# Patient Record
Sex: Male | Born: 1935 | Race: White | Hispanic: No | Marital: Married | Smoking: Never smoker
Health system: Southern US, Community
[De-identification: ages and names within clinical notes are randomized; demographics above are authoritative.]

## PROBLEM LIST (undated history)

## (undated) ENCOUNTER — Emergency Department: Payer: Medicare Other | Source: Home / Self Care

## (undated) DIAGNOSIS — E785 Hyperlipidemia, unspecified: Secondary | ICD-10-CM

## (undated) DIAGNOSIS — N400 Enlarged prostate without lower urinary tract symptoms: Secondary | ICD-10-CM

## (undated) DIAGNOSIS — D473 Essential (hemorrhagic) thrombocythemia: Secondary | ICD-10-CM

## (undated) DIAGNOSIS — I10 Essential (primary) hypertension: Secondary | ICD-10-CM

## (undated) HISTORY — DX: Essential (primary) hypertension: I10

## (undated) HISTORY — DX: Hyperlipidemia, unspecified: E78.5

## (undated) HISTORY — PX: SKIN CANCER EXCISION: SHX779

## (undated) HISTORY — DX: Benign prostatic hyperplasia without lower urinary tract symptoms: N40.0

## (undated) HISTORY — DX: Essential (hemorrhagic) thrombocythemia: D47.3

---

## 1999-06-01 ENCOUNTER — Ambulatory Visit (HOSPITAL_COMMUNITY): Admission: RE | Admit: 1999-06-01 | Discharge: 1999-06-01 | Payer: Self-pay | Admitting: Internal Medicine

## 1999-06-01 ENCOUNTER — Encounter: Payer: Self-pay | Admitting: Internal Medicine

## 2008-01-29 ENCOUNTER — Ambulatory Visit: Payer: Self-pay | Admitting: Oncology

## 2008-02-03 LAB — CBC WITH DIFFERENTIAL/PLATELET
BASO%: 0.2 % (ref 0.0–2.0)
Basophils Absolute: 0 10*3/uL (ref 0.0–0.1)
EOS%: 3.6 % (ref 0.0–7.0)
HCT: 50.8 % — ABNORMAL HIGH (ref 38.7–49.9)
HGB: 16.8 g/dL (ref 13.0–17.1)
LYMPH%: 14.2 % (ref 14.0–48.0)
MCH: 25 pg — ABNORMAL LOW (ref 28.0–33.4)
MCHC: 33 g/dL (ref 32.0–35.9)
MCV: 75.9 fL — ABNORMAL LOW (ref 81.6–98.0)
MONO%: 5.1 % (ref 0.0–13.0)
NEUT%: 76.9 % — ABNORMAL HIGH (ref 40.0–75.0)
Platelets: 644 10*3/uL — ABNORMAL HIGH (ref 145–400)
lymph#: 1.1 10*3/uL (ref 0.9–3.3)

## 2008-02-06 LAB — IRON AND TIBC
Iron: 132 ug/dL (ref 42–165)
TIBC: 366 ug/dL (ref 215–435)
UIBC: 234 ug/dL

## 2008-02-06 LAB — COMPREHENSIVE METABOLIC PANEL
AST: 22 U/L (ref 0–37)
Albumin: 4.6 g/dL (ref 3.5–5.2)
Alkaline Phosphatase: 80 U/L (ref 39–117)
BUN: 12 mg/dL (ref 6–23)
Creatinine, Ser: 1.24 mg/dL (ref 0.40–1.50)
Glucose, Bld: 90 mg/dL (ref 70–99)
Potassium: 4.5 mEq/L (ref 3.5–5.3)

## 2008-02-06 LAB — FERRITIN: Ferritin: 17 ng/mL — ABNORMAL LOW (ref 22–322)

## 2008-04-30 ENCOUNTER — Ambulatory Visit: Payer: Self-pay | Admitting: Oncology

## 2008-07-12 ENCOUNTER — Ambulatory Visit: Payer: Self-pay | Admitting: Oncology

## 2008-07-14 LAB — CBC WITH DIFFERENTIAL/PLATELET
BASO%: 0.3 % (ref 0.0–2.0)
Basophils Absolute: 0 10*3/uL (ref 0.0–0.1)
Eosinophils Absolute: 0.3 10*3/uL (ref 0.0–0.5)
HCT: 53.2 % — ABNORMAL HIGH (ref 38.7–49.9)
HGB: 17.4 g/dL — ABNORMAL HIGH (ref 13.0–17.1)
LYMPH%: 16 % (ref 14.0–48.0)
MCHC: 32.7 g/dL (ref 32.0–35.9)
MONO#: 0.3 10*3/uL (ref 0.1–0.9)
NEUT%: 75.1 % — ABNORMAL HIGH (ref 40.0–75.0)
Platelets: 630 10*3/uL — ABNORMAL HIGH (ref 145–400)
WBC: 6.9 10*3/uL (ref 4.0–10.0)
lymph#: 1.1 10*3/uL (ref 0.9–3.3)

## 2008-11-09 ENCOUNTER — Ambulatory Visit: Payer: Self-pay | Admitting: Oncology

## 2008-11-11 LAB — CBC WITH DIFFERENTIAL/PLATELET
BASO%: 0.2 % (ref 0.0–2.0)
Basophils Absolute: 0 10*3/uL (ref 0.0–0.1)
EOS%: 4.4 % (ref 0.0–7.0)
HCT: 53.1 % — ABNORMAL HIGH (ref 38.4–49.9)
HGB: 17.7 g/dL — ABNORMAL HIGH (ref 13.0–17.1)
LYMPH%: 13.7 % — ABNORMAL LOW (ref 14.0–49.0)
MCH: 24.8 pg — ABNORMAL LOW (ref 27.2–33.4)
MCHC: 33.3 g/dL (ref 32.0–36.0)
NEUT%: 77.6 % — ABNORMAL HIGH (ref 39.0–75.0)
Platelets: 726 10*3/uL — ABNORMAL HIGH (ref 140–400)

## 2008-11-11 LAB — COMPREHENSIVE METABOLIC PANEL
ALT: 15 U/L (ref 0–53)
BUN: 12 mg/dL (ref 6–23)
CO2: 24 mEq/L (ref 19–32)
Calcium: 9.5 mg/dL (ref 8.4–10.5)
Creatinine, Ser: 1.28 mg/dL (ref 0.40–1.50)
Total Bilirubin: 1.7 mg/dL — ABNORMAL HIGH (ref 0.3–1.2)

## 2009-03-14 ENCOUNTER — Ambulatory Visit: Payer: Self-pay | Admitting: Oncology

## 2009-03-16 LAB — CBC WITH DIFFERENTIAL/PLATELET
Basophils Absolute: 0 10*3/uL (ref 0.0–0.1)
Eosinophils Absolute: 0.2 10*3/uL (ref 0.0–0.5)
HGB: 17.7 g/dL — ABNORMAL HIGH (ref 13.0–17.1)
MCV: 75 fL — ABNORMAL LOW (ref 79.3–98.0)
MONO#: 0.4 10*3/uL (ref 0.1–0.9)
MONO%: 5.1 % (ref 0.0–14.0)
NEUT#: 5.7 10*3/uL (ref 1.5–6.5)
RDW: 17.2 % — ABNORMAL HIGH (ref 11.0–14.6)
WBC: 7.3 10*3/uL (ref 4.0–10.3)
lymph#: 0.9 10*3/uL (ref 0.9–3.3)

## 2009-03-16 LAB — COMPREHENSIVE METABOLIC PANEL
Albumin: 4.5 g/dL (ref 3.5–5.2)
BUN: 11 mg/dL (ref 6–23)
CO2: 27 mEq/L (ref 19–32)
Calcium: 9.7 mg/dL (ref 8.4–10.5)
Chloride: 103 mEq/L (ref 96–112)
Glucose, Bld: 89 mg/dL (ref 70–99)
Potassium: 4.2 mEq/L (ref 3.5–5.3)
Total Protein: 7.5 g/dL (ref 6.0–8.3)

## 2009-03-16 LAB — IRON AND TIBC
Iron: 65 ug/dL (ref 42–165)
TIBC: 392 ug/dL (ref 215–435)
UIBC: 327 ug/dL

## 2009-07-18 ENCOUNTER — Ambulatory Visit: Payer: Self-pay | Admitting: Oncology

## 2009-07-20 ENCOUNTER — Ambulatory Visit (HOSPITAL_COMMUNITY): Admission: RE | Admit: 2009-07-20 | Discharge: 2009-07-20 | Payer: Self-pay | Admitting: Oncology

## 2009-07-20 LAB — CBC WITH DIFFERENTIAL/PLATELET
Basophils Absolute: 0 10*3/uL (ref 0.0–0.1)
EOS%: 4.4 % (ref 0.0–7.0)
Eosinophils Absolute: 0.4 10*3/uL (ref 0.0–0.5)
HCT: 52.8 % — ABNORMAL HIGH (ref 38.4–49.9)
HGB: 17 g/dL (ref 13.0–17.1)
MCH: 24.4 pg — ABNORMAL LOW (ref 27.2–33.4)
MONO#: 0.2 10*3/uL (ref 0.1–0.9)
NEUT#: 7.7 10*3/uL — ABNORMAL HIGH (ref 1.5–6.5)
NEUT%: 78.8 % — ABNORMAL HIGH (ref 39.0–75.0)
RDW: 16.9 % — ABNORMAL HIGH (ref 11.0–14.6)
lymph#: 1.5 10*3/uL (ref 0.9–3.3)

## 2009-07-20 LAB — COMPREHENSIVE METABOLIC PANEL
AST: 23 U/L (ref 0–37)
Albumin: 4.4 g/dL (ref 3.5–5.2)
BUN: 13 mg/dL (ref 6–23)
CO2: 25 mEq/L (ref 19–32)
Calcium: 9.9 mg/dL (ref 8.4–10.5)
Chloride: 104 mEq/L (ref 96–112)
Creatinine, Ser: 1.19 mg/dL (ref 0.40–1.50)
Glucose, Bld: 98 mg/dL (ref 70–99)
Potassium: 4.6 mEq/L (ref 3.5–5.3)

## 2009-08-15 ENCOUNTER — Ambulatory Visit: Payer: Self-pay | Admitting: Oncology

## 2009-08-15 ENCOUNTER — Ambulatory Visit (HOSPITAL_COMMUNITY): Admission: RE | Admit: 2009-08-15 | Discharge: 2009-08-15 | Payer: Self-pay | Admitting: Oncology

## 2009-08-17 ENCOUNTER — Ambulatory Visit: Payer: Self-pay | Admitting: Oncology

## 2009-09-19 ENCOUNTER — Ambulatory Visit: Payer: Self-pay | Admitting: Oncology

## 2009-09-20 LAB — COMPREHENSIVE METABOLIC PANEL
AST: 24 U/L (ref 0–37)
Albumin: 4.4 g/dL (ref 3.5–5.2)
BUN: 17 mg/dL (ref 6–23)
Calcium: 9.3 mg/dL (ref 8.4–10.5)
Chloride: 102 mEq/L (ref 96–112)
Creatinine, Ser: 1.25 mg/dL (ref 0.40–1.50)
Glucose, Bld: 100 mg/dL — ABNORMAL HIGH (ref 70–99)
Potassium: 4.4 mEq/L (ref 3.5–5.3)

## 2009-09-20 LAB — CBC WITH DIFFERENTIAL/PLATELET
Basophils Absolute: 0.1 10*3/uL (ref 0.0–0.1)
EOS%: 3.7 % (ref 0.0–7.0)
Eosinophils Absolute: 0.2 10*3/uL (ref 0.0–0.5)
HCT: 50.8 % — ABNORMAL HIGH (ref 38.4–49.9)
HGB: 16.8 g/dL (ref 13.0–17.1)
MCH: 25.8 pg — ABNORMAL LOW (ref 27.2–33.4)
MCV: 78.1 fL — ABNORMAL LOW (ref 79.3–98.0)
MONO%: 7.4 % (ref 0.0–14.0)
NEUT#: 4 10*3/uL (ref 1.5–6.5)
NEUT%: 65.6 % (ref 39.0–75.0)
lymph#: 1.4 10*3/uL (ref 0.9–3.3)

## 2009-10-18 LAB — CBC WITH DIFFERENTIAL/PLATELET
BASO%: 0.1 % (ref 0.0–2.0)
Basophils Absolute: 0 10*3/uL (ref 0.0–0.1)
EOS%: 2.6 % (ref 0.0–7.0)
Eosinophils Absolute: 0.2 10*3/uL (ref 0.0–0.5)
HCT: 48.9 % (ref 38.4–49.9)
HGB: 16.4 g/dL (ref 13.0–17.1)
LYMPH%: 18.3 % (ref 14.0–49.0)
MCH: 26.8 pg — ABNORMAL LOW (ref 27.2–33.4)
MCHC: 33.5 g/dL (ref 32.0–36.0)
MCV: 80.1 fL (ref 79.3–98.0)
MONO#: 0.3 10*3/uL (ref 0.1–0.9)
MONO%: 5.2 % (ref 0.0–14.0)
NEUT#: 4.7 10*3/uL (ref 1.5–6.5)
NEUT%: 73.8 % (ref 39.0–75.0)
Platelets: 402 10*3/uL — ABNORMAL HIGH (ref 140–400)
RBC: 6.1 10*6/uL — ABNORMAL HIGH (ref 4.20–5.82)
RDW: 23.4 % — ABNORMAL HIGH (ref 11.0–14.6)
WBC: 6.4 10*3/uL (ref 4.0–10.3)
lymph#: 1.2 10*3/uL (ref 0.9–3.3)

## 2009-10-18 LAB — COMPREHENSIVE METABOLIC PANEL
ALT: 17 U/L (ref 0–53)
AST: 23 U/L (ref 0–37)
Albumin: 4.1 g/dL (ref 3.5–5.2)
Alkaline Phosphatase: 72 U/L (ref 39–117)
BUN: 12 mg/dL (ref 6–23)
CO2: 24 mEq/L (ref 19–32)
Calcium: 9.2 mg/dL (ref 8.4–10.5)
Chloride: 103 mEq/L (ref 96–112)
Creatinine, Ser: 1.24 mg/dL (ref 0.40–1.50)
Glucose, Bld: 88 mg/dL (ref 70–99)
Potassium: 4.6 mEq/L (ref 3.5–5.3)
Sodium: 138 mEq/L (ref 135–145)
Total Bilirubin: 1.1 mg/dL (ref 0.3–1.2)
Total Protein: 7.1 g/dL (ref 6.0–8.3)

## 2009-12-16 ENCOUNTER — Ambulatory Visit: Payer: Self-pay | Admitting: Oncology

## 2009-12-20 LAB — COMPREHENSIVE METABOLIC PANEL
ALT: 15 U/L (ref 0–53)
AST: 21 U/L (ref 0–37)
Albumin: 4.4 g/dL (ref 3.5–5.2)
Alkaline Phosphatase: 71 U/L (ref 39–117)
BUN: 13 mg/dL (ref 6–23)
CO2: 21 mEq/L (ref 19–32)
Calcium: 9.4 mg/dL (ref 8.4–10.5)
Chloride: 101 mEq/L (ref 96–112)
Creatinine, Ser: 1.09 mg/dL (ref 0.40–1.50)
Glucose, Bld: 110 mg/dL — ABNORMAL HIGH (ref 70–99)
Potassium: 4.5 mEq/L (ref 3.5–5.3)
Sodium: 138 mEq/L (ref 135–145)
Total Bilirubin: 1.1 mg/dL (ref 0.3–1.2)
Total Protein: 7.4 g/dL (ref 6.0–8.3)

## 2009-12-20 LAB — CBC WITH DIFFERENTIAL/PLATELET
BASO%: 0.1 % (ref 0.0–2.0)
Basophils Absolute: 0 10*3/uL (ref 0.0–0.1)
EOS%: 3.1 % (ref 0.0–7.0)
Eosinophils Absolute: 0.2 10*3/uL (ref 0.0–0.5)
HCT: 51 % — ABNORMAL HIGH (ref 38.4–49.9)
HGB: 17.1 g/dL (ref 13.0–17.1)
LYMPH%: 16.6 % (ref 14.0–49.0)
MCH: 29.7 pg (ref 27.2–33.4)
MCHC: 33.5 g/dL (ref 32.0–36.0)
MCV: 88.7 fL (ref 79.3–98.0)
MONO#: 0.4 10*3/uL (ref 0.1–0.9)
MONO%: 5.5 % (ref 0.0–14.0)
NEUT#: 4.7 10*3/uL (ref 1.5–6.5)
NEUT%: 74.7 % (ref 39.0–75.0)
Platelets: 361 10*3/uL (ref 140–400)
RBC: 5.75 10*6/uL (ref 4.20–5.82)
RDW: 19.3 % — ABNORMAL HIGH (ref 11.0–14.6)
WBC: 6.4 10*3/uL (ref 4.0–10.3)
lymph#: 1.1 10*3/uL (ref 0.9–3.3)

## 2010-03-21 ENCOUNTER — Ambulatory Visit: Payer: Self-pay | Admitting: Oncology

## 2010-03-23 LAB — CBC WITH DIFFERENTIAL/PLATELET
BASO%: 0.3 % (ref 0.0–2.0)
Basophils Absolute: 0 10*3/uL (ref 0.0–0.1)
EOS%: 3.2 % (ref 0.0–7.0)
Eosinophils Absolute: 0.2 10*3/uL (ref 0.0–0.5)
HCT: 43.1 % (ref 38.4–49.9)
HGB: 15 g/dL (ref 13.0–17.1)
LYMPH%: 21.4 % (ref 14.0–49.0)
MCH: 34.2 pg — ABNORMAL HIGH (ref 27.2–33.4)
MCHC: 34.8 g/dL (ref 32.0–36.0)
MCV: 98 fL (ref 79.3–98.0)
MONO#: 0.3 10*3/uL (ref 0.1–0.9)
MONO%: 5.9 % (ref 0.0–14.0)
NEUT#: 3.4 10*3/uL (ref 1.5–6.5)
NEUT%: 69.2 % (ref 39.0–75.0)
Platelets: 453 10*3/uL — ABNORMAL HIGH (ref 140–400)
RBC: 4.4 10*6/uL (ref 4.20–5.82)
RDW: 17.2 % — ABNORMAL HIGH (ref 11.0–14.6)
WBC: 4.9 10*3/uL (ref 4.0–10.3)
lymph#: 1 10*3/uL (ref 0.9–3.3)

## 2010-03-23 LAB — COMPREHENSIVE METABOLIC PANEL
ALT: 20 U/L (ref 0–53)
AST: 23 U/L (ref 0–37)
Albumin: 4.3 g/dL (ref 3.5–5.2)
Alkaline Phosphatase: 65 U/L (ref 39–117)
BUN: 14 mg/dL (ref 6–23)
CO2: 27 mEq/L (ref 19–32)
Calcium: 9.6 mg/dL (ref 8.4–10.5)
Chloride: 101 mEq/L (ref 96–112)
Creatinine, Ser: 1.09 mg/dL (ref 0.40–1.50)
Glucose, Bld: 91 mg/dL (ref 70–99)
Potassium: 5 mEq/L (ref 3.5–5.3)
Sodium: 139 mEq/L (ref 135–145)
Total Bilirubin: 1.4 mg/dL — ABNORMAL HIGH (ref 0.3–1.2)
Total Protein: 7 g/dL (ref 6.0–8.3)

## 2010-06-21 ENCOUNTER — Ambulatory Visit: Payer: Self-pay | Admitting: Oncology

## 2010-06-23 LAB — CBC WITH DIFFERENTIAL/PLATELET
BASO%: 0.7 % (ref 0.0–2.0)
Basophils Absolute: 0 10*3/uL (ref 0.0–0.1)
EOS%: 2.5 % (ref 0.0–7.0)
Eosinophils Absolute: 0.1 10*3/uL (ref 0.0–0.5)
HCT: 43.3 % (ref 38.4–49.9)
HGB: 15.1 g/dL (ref 13.0–17.1)
LYMPH%: 24.1 % (ref 14.0–49.0)
MCH: 33.9 pg — ABNORMAL HIGH (ref 27.2–33.4)
MCHC: 34.9 g/dL (ref 32.0–36.0)
MCV: 97.1 fL (ref 79.3–98.0)
MONO#: 0.3 10*3/uL (ref 0.1–0.9)
MONO%: 5.6 % (ref 0.0–14.0)
NEUT#: 3 10*3/uL (ref 1.5–6.5)
NEUT%: 67.1 % (ref 39.0–75.0)
Platelets: 197 10*3/uL (ref 140–400)
RBC: 4.46 10*6/uL (ref 4.20–5.82)
RDW: 15.3 % — ABNORMAL HIGH (ref 11.0–14.6)
WBC: 4.5 10*3/uL (ref 4.0–10.3)
lymph#: 1.1 10*3/uL (ref 0.9–3.3)
nRBC: 0 % (ref 0–0)

## 2010-06-23 LAB — COMPREHENSIVE METABOLIC PANEL
ALT: 16 U/L (ref 0–53)
AST: 21 U/L (ref 0–37)
Albumin: 4.3 g/dL (ref 3.5–5.2)
Alkaline Phosphatase: 52 U/L (ref 39–117)
BUN: 10 mg/dL (ref 6–23)
CO2: 28 mEq/L (ref 19–32)
Calcium: 9.1 mg/dL (ref 8.4–10.5)
Chloride: 102 mEq/L (ref 96–112)
Creatinine, Ser: 1.18 mg/dL (ref 0.40–1.50)
Glucose, Bld: 96 mg/dL (ref 70–99)
Potassium: 4.3 mEq/L (ref 3.5–5.3)
Sodium: 138 mEq/L (ref 135–145)
Total Bilirubin: 1 mg/dL (ref 0.3–1.2)
Total Protein: 7.3 g/dL (ref 6.0–8.3)

## 2010-08-21 LAB — DIFFERENTIAL
Basophils Absolute: 0 10*3/uL (ref 0.0–0.1)
Eosinophils Relative: 5 % (ref 0–5)
Lymphocytes Relative: 18 % (ref 12–46)
Lymphs Abs: 1.3 10*3/uL (ref 0.7–4.0)
Monocytes Absolute: 0.4 10*3/uL (ref 0.1–1.0)
Neutro Abs: 4.9 10*3/uL (ref 1.7–7.7)

## 2010-08-21 LAB — CBC
HCT: 50.8 % (ref 39.0–52.0)
Hemoglobin: 16.2 g/dL (ref 13.0–17.0)
RBC: 6.7 MIL/uL — ABNORMAL HIGH (ref 4.22–5.81)
RDW: 17.9 % — ABNORMAL HIGH (ref 11.5–15.5)
WBC: 7.1 10*3/uL (ref 4.0–10.5)

## 2010-08-21 LAB — BONE MARROW EXAM

## 2010-10-24 ENCOUNTER — Other Ambulatory Visit: Payer: Self-pay | Admitting: Oncology

## 2010-10-24 ENCOUNTER — Encounter (HOSPITAL_BASED_OUTPATIENT_CLINIC_OR_DEPARTMENT_OTHER): Payer: Medicare Other | Admitting: Oncology

## 2010-10-24 DIAGNOSIS — D47Z9 Other specified neoplasms of uncertain behavior of lymphoid, hematopoietic and related tissue: Secondary | ICD-10-CM

## 2010-10-24 DIAGNOSIS — D473 Essential (hemorrhagic) thrombocythemia: Secondary | ICD-10-CM

## 2010-10-24 LAB — CBC WITH DIFFERENTIAL/PLATELET
Basophils Absolute: 0 10*3/uL (ref 0.0–0.1)
Eosinophils Absolute: 0.2 10*3/uL (ref 0.0–0.5)
HGB: 13.5 g/dL (ref 13.0–17.1)
LYMPH%: 23.9 % (ref 14.0–49.0)
MCV: 100.6 fL — ABNORMAL HIGH (ref 79.3–98.0)
MONO%: 7.2 % (ref 0.0–14.0)
NEUT#: 2.8 10*3/uL (ref 1.5–6.5)
Platelets: 417 10*3/uL — ABNORMAL HIGH (ref 140–400)

## 2010-10-24 LAB — COMPREHENSIVE METABOLIC PANEL
Albumin: 4.2 g/dL (ref 3.5–5.2)
Alkaline Phosphatase: 54 U/L (ref 39–117)
BUN: 10 mg/dL (ref 6–23)
Glucose, Bld: 89 mg/dL (ref 70–99)
Potassium: 4 mEq/L (ref 3.5–5.3)

## 2011-01-25 ENCOUNTER — Other Ambulatory Visit: Payer: Self-pay | Admitting: Medical

## 2011-01-25 ENCOUNTER — Encounter (HOSPITAL_BASED_OUTPATIENT_CLINIC_OR_DEPARTMENT_OTHER): Payer: Medicare Other | Admitting: Oncology

## 2011-01-25 DIAGNOSIS — D47Z9 Other specified neoplasms of uncertain behavior of lymphoid, hematopoietic and related tissue: Secondary | ICD-10-CM

## 2011-01-25 DIAGNOSIS — D473 Essential (hemorrhagic) thrombocythemia: Secondary | ICD-10-CM

## 2011-01-25 LAB — CBC WITH DIFFERENTIAL/PLATELET
BASO%: 0.6 % (ref 0.0–2.0)
EOS%: 3.1 % (ref 0.0–7.0)
HCT: 43.6 % (ref 38.4–49.9)
LYMPH%: 19.3 % (ref 14.0–49.0)
MCH: 31.1 pg (ref 27.2–33.4)
MCHC: 34.4 g/dL (ref 32.0–36.0)
NEUT%: 68.4 % (ref 39.0–75.0)
Platelets: 372 10*3/uL (ref 140–400)
RBC: 4.83 10*6/uL (ref 4.20–5.82)
lymph#: 1 10*3/uL (ref 0.9–3.3)
nRBC: 0 % (ref 0–0)

## 2011-01-25 LAB — COMPREHENSIVE METABOLIC PANEL
AST: 20 U/L (ref 0–37)
Alkaline Phosphatase: 50 U/L (ref 39–117)
BUN: 14 mg/dL (ref 6–23)
Creatinine, Ser: 1 mg/dL (ref 0.50–1.35)
Total Bilirubin: 0.9 mg/dL (ref 0.3–1.2)

## 2011-04-14 ENCOUNTER — Telehealth: Payer: Self-pay | Admitting: Oncology

## 2011-04-14 NOTE — Telephone Encounter (Signed)
S/w pt, advised 11/29 appt has been cx'd due to Epic. New appt is 06/26/11 @ 10am. Pt verbalized understanding. I have also mailed pt an appt calendar.

## 2011-06-19 ENCOUNTER — Encounter: Payer: Self-pay | Admitting: *Deleted

## 2011-06-26 ENCOUNTER — Other Ambulatory Visit (HOSPITAL_BASED_OUTPATIENT_CLINIC_OR_DEPARTMENT_OTHER): Payer: Medicare Other | Admitting: Lab

## 2011-06-26 ENCOUNTER — Telehealth: Payer: Self-pay | Admitting: Oncology

## 2011-06-26 ENCOUNTER — Ambulatory Visit (HOSPITAL_BASED_OUTPATIENT_CLINIC_OR_DEPARTMENT_OTHER): Payer: Medicare Other | Admitting: Oncology

## 2011-06-26 VITALS — BP 111/68 | HR 54 | Temp 97.2°F | Ht 69.0 in | Wt 167.7 lb

## 2011-06-26 DIAGNOSIS — D47Z9 Other specified neoplasms of uncertain behavior of lymphoid, hematopoietic and related tissue: Secondary | ICD-10-CM

## 2011-06-26 DIAGNOSIS — D473 Essential (hemorrhagic) thrombocythemia: Secondary | ICD-10-CM

## 2011-06-26 LAB — CBC WITH DIFFERENTIAL/PLATELET
Basophils Absolute: 0 10*3/uL (ref 0.0–0.1)
EOS%: 3.1 % (ref 0.0–7.0)
HCT: 44.8 % (ref 38.4–49.9)
HGB: 14.9 g/dL (ref 13.0–17.1)
LYMPH%: 20.7 % (ref 14.0–49.0)
MCH: 32.1 pg (ref 27.2–33.4)
MCV: 96.4 fL (ref 79.3–98.0)
MONO%: 6.3 % (ref 0.0–14.0)
NEUT%: 69.2 % (ref 39.0–75.0)
Platelets: 393 10*3/uL (ref 140–400)
lymph#: 1 10*3/uL (ref 0.9–3.3)

## 2011-06-26 LAB — COMPREHENSIVE METABOLIC PANEL
ALT: 15 U/L (ref 0–53)
AST: 22 U/L (ref 0–37)
Albumin: 4.2 g/dL (ref 3.5–5.2)
Alkaline Phosphatase: 51 U/L (ref 39–117)
Calcium: 8.8 mg/dL (ref 8.4–10.5)
Chloride: 102 mEq/L (ref 96–112)
Potassium: 4.3 mEq/L (ref 3.5–5.3)
Sodium: 138 mEq/L (ref 135–145)

## 2011-06-26 NOTE — Progress Notes (Signed)
Hematology and Oncology Follow Up Visit  Nicholas Contreras [id number] 08/07/1928 78 y.o. 06/26/2011 10:54 AM No primary provider on file.No ref. provider found   PRINCIPAL DIAGNOSIS:  This is an 78 year old gentleman with myeloproliferative disorder presenting with thrombocytosis, JAK2 positive.  He had a bone marrow biopsy in March 2011.  CURRENT THERAPY:  Hydroxyurea 500 mg to keep his platelet count around 400,000.  HISTORY OF PRESENT ILLNESS:  Nicholas Contreras presents today for a followup visit.  He has continued to tolerate hydroxyurea without any major toxicity.  Does not report any nausea,vomiting or GI toxicity to speak of at this point.  He is not reporting any changes in his performance status or activity level at this time. No  bleeding.No headaches.  He does report some occasional fatigue, but otherwise performance status and activity level remains excellent.  Medications:Current outpatient prescriptions:aspirin 81 MG tablet, Take 81 mg by mouth daily., Disp: , Rfl: ;  atenolol (TENORMIN) 50 MG tablet, Take 50 mg by mouth daily., Disp: , Rfl: ;  enalapril (VASOTEC) 20 MG tablet, Take 20 mg by mouth daily., Disp: , Rfl: ;  ergocalciferol (VITAMIN D2) 50000 UNITS capsule, Take 50,000 Units by mouth once a week., Disp: , Rfl: ;  felodipine (PLENDIL) 10 MG 24 hr tablet, Take 10 mg by mouth daily., Disp: , Rfl:  hydrochlorothiazide (HYDRODIURIL) 12.5 MG tablet, Take 12.5 mg by mouth daily., Disp: , Rfl: ;  hydroxyurea (HYDREA) 500 MG capsule, Take 500 mg by mouth daily. May take with food to minimize GI side effects., Disp: , Rfl: ;  lovastatin (MEVACOR) 20 MG tablet, Take 20 mg by mouth 2 (two) times daily., Disp: , Rfl: ;  Tamsulosin HCl (FLOMAX) 0.4 MG CAPS, Take 0.4 mg by mouth daily., Disp: , Rfl:   Allergies: Allergies no known allergies  Past Medical History, Surgical history, Social history, and Family History were reviewed and updated.   REVIEW OF SYSTEMS:   No blurred vision, double  vision,motor sensory neuropathy. No alteration in mental status, psychiatric issues, depression.  No fever, chills, sweats. No cough, hemoptysis, hematemesis.  No nausea, vomiting, abdominal pain, hematochezia, melena, genitourinary complaints.  Rest of review of systems unremarkable.  Physical Exam: VS:  BP 111/68 P: 54  R 20   T 97.2  Weight: 167.7  General:  Alert, awake gentleman appeared in no active distress.HEENT:  Head is normocephalic, atraumatic.  Pupils equal, round, reactive to light.  Oral mucosa moist and pink.  Neck:  Supple without lymphadenopathy.  Heart:  Regular rate and rhythm.  S1 and S2.  Lungs:  Clear to auscultation.  No rhonchi or wheezes.  No dullness to percussion.  Abdomen:  Soft, nontender.  No hepatosplenomegaly.  Extremities:  No edema.   Lab Results:   Lab 06/26/11 1019  WBC 4.7  HGB 14.9  HCT 44.8  PLT 393  MCV 96.4  MCH 32.1  MCHC 33.3  RDW 16.6*  LYMPHSABS 1.0  MONOABS 0.3  EOSABS 0.1  BASOSABS 0.0  BANDABS --    CMP   No results found for this basename: NA:5,K:5,CL:5,CO2:5,GLUCOSE:5,BUN:5,CREATININE:5,GFRCGP,:5,CALCIUM:5,MG:5,AST:5,ALT:5,ALKPHOS:5,BILITOT:5 in the last 168 hours      Component Value Date/Time   BILITOT 0.9 01/25/2011 1016   BILITOT 0.9 01/25/2011 1016    Radiological Studies:  No results found.   Impression and Plan: 1. Essential thrombocythemia.  His hydroxyurea at 500 mg is able to stabilize platelet counts adequately at this point.  Continue on the current dose. 2. Thrombosis prophylaxis.  Continue  to be on low-dose aspirin.  .    Patient is to return for a follow up visit in 6 months, at which time CBC with differential and CMET will be performed.  He knows to call our clinic if he has any questions or concerns.  Spent more than half the time coordinating care.    Marcos Eke, MD 1/29/201310:54 AM

## 2011-06-26 NOTE — Telephone Encounter (Signed)
gv pt appt schedule for July.  

## 2011-08-06 ENCOUNTER — Other Ambulatory Visit: Payer: Self-pay | Admitting: *Deleted

## 2011-12-25 ENCOUNTER — Other Ambulatory Visit (HOSPITAL_BASED_OUTPATIENT_CLINIC_OR_DEPARTMENT_OTHER): Payer: Medicare Other | Admitting: Lab

## 2011-12-25 ENCOUNTER — Telehealth: Payer: Self-pay | Admitting: Oncology

## 2011-12-25 ENCOUNTER — Ambulatory Visit (HOSPITAL_BASED_OUTPATIENT_CLINIC_OR_DEPARTMENT_OTHER): Payer: Medicare Other | Admitting: Oncology

## 2011-12-25 VITALS — BP 113/68 | HR 56 | Temp 97.3°F | Ht 69.0 in | Wt 164.4 lb

## 2011-12-25 DIAGNOSIS — D473 Essential (hemorrhagic) thrombocythemia: Secondary | ICD-10-CM

## 2011-12-25 LAB — COMPREHENSIVE METABOLIC PANEL WITH GFR
ALT: 14 U/L (ref 0–53)
AST: 20 U/L (ref 0–37)
Albumin: 4.5 g/dL (ref 3.5–5.2)
Alkaline Phosphatase: 61 U/L (ref 39–117)
BUN: 11 mg/dL (ref 6–23)
CO2: 25 meq/L (ref 19–32)
Calcium: 9.4 mg/dL (ref 8.4–10.5)
Chloride: 104 meq/L (ref 96–112)
Creatinine, Ser: 1.12 mg/dL (ref 0.50–1.35)
Glucose, Bld: 92 mg/dL (ref 70–99)
Potassium: 4.1 meq/L (ref 3.5–5.3)
Sodium: 140 meq/L (ref 135–145)
Total Bilirubin: 1 mg/dL (ref 0.3–1.2)
Total Protein: 7 g/dL (ref 6.0–8.3)

## 2011-12-25 LAB — CBC WITH DIFFERENTIAL/PLATELET
BASO%: 0.4 % (ref 0.0–2.0)
Basophils Absolute: 0 10*3/uL (ref 0.0–0.1)
EOS%: 3 % (ref 0.0–7.0)
HCT: 45.4 % (ref 38.4–49.9)
HGB: 15.4 g/dL (ref 13.0–17.1)
MCH: 32.8 pg (ref 27.2–33.4)
MCHC: 33.8 g/dL (ref 32.0–36.0)
MCV: 97.1 fL (ref 79.3–98.0)
MONO%: 6.4 % (ref 0.0–14.0)
NEUT%: 68.8 % (ref 39.0–75.0)
RDW: 13.6 % (ref 11.0–14.6)
lymph#: 1.2 10*3/uL (ref 0.9–3.3)

## 2011-12-25 NOTE — Progress Notes (Signed)
Hematology and Oncology Follow Up Visit  Nicholas Contreras [id number] 11-22-1928 78 y.o. 12/25/2011 9:46 AM Gwynneth Aliment, MDNo ref. provider found   PRINCIPAL DIAGNOSIS:  This is an 78 year old gentleman with myeloproliferative disorder presenting with thrombocytosis, JAK2 positive.  He had a bone marrow biopsy in March 2011.  CURRENT THERAPY:  Hydroxyurea 500 mg daily to keep his platelet count around 400,000.  HISTORY OF PRESENT ILLNESS:  Mr. Dunsworth presents today for a followup visit.  He has continued to tolerate hydroxyurea without any major toxicity.  Does not report any nausea,vomiting or GI toxicity to speak of at this point.  He is not reporting any changes in his performance status or activity level at this time. No  bleeding.No headaches.  He does report some occasional fatigue, but otherwise performance status and activity level remains excellent.  Medications:Current outpatient prescriptions:aspirin 81 MG tablet, Take 81 mg by mouth daily., Disp: , Rfl: ;  atenolol (TENORMIN) 50 MG tablet, Take 50 mg by mouth daily., Disp: , Rfl: ;  enalapril (VASOTEC) 20 MG tablet, Take 20 mg by mouth daily., Disp: , Rfl: ;  ergocalciferol (VITAMIN D2) 50000 UNITS capsule, Take 50,000 Units by mouth once a week., Disp: , Rfl: ;  felodipine (PLENDIL) 10 MG 24 hr tablet, Take 10 mg by mouth daily., Disp: , Rfl:  hydrochlorothiazide (HYDRODIURIL) 12.5 MG tablet, Take 12.5 mg by mouth daily., Disp: , Rfl: ;  hydroxyurea (HYDREA) 500 MG capsule, Take 500 mg by mouth daily. May take with food to minimize GI side effects., Disp: , Rfl: ;  lovastatin (MEVACOR) 20 MG tablet, Take 20 mg by mouth 2 (two) times daily., Disp: , Rfl: ;  Tamsulosin HCl (FLOMAX) 0.4 MG CAPS, Take 0.4 mg by mouth daily., Disp: , Rfl:   Allergies: No Known Allergies  Past Medical History, Surgical history, Social history, and Family History were reviewed and updated.   REVIEW OF SYSTEMS:   No blurred vision, double vision,motor  sensory neuropathy. No alteration in mental status, psychiatric issues, depression.  No fever, chills, sweats. No cough, hemoptysis, hematemesis.  No nausea, vomiting, abdominal pain, hematochezia, melena, genitourinary complaints.  Rest of review of systems unremarkable.  Physical Exam: VSS  General:  Alert, awake gentleman appeared in no active distress.HEENT:  Head is normocephalic, atraumatic.  Pupils equal, round, reactive to light.  Oral mucosa moist and pink.  Neck:  Supple without lymphadenopathy.  Heart:  Regular rate and rhythm.  S1 and S2.  Lungs:  Clear to auscultation.  No rhonchi or wheezes.  No dullness to percussion.  Abdomen:  Soft, nontender.  No hepatosplenomegaly.  Extremities:  No edema.   Lab Results:   Lab 12/25/11 0903  WBC 5.5  HGB 15.4  HCT 45.4  PLT 435*  MCV 97.1  MCH 32.8  MCHC 33.8  RDW 13.6  LYMPHSABS 1.2  MONOABS 0.4  EOSABS 0.2  BASOSABS 0.0  BANDABS --      Impression and Plan: 1. Essential thrombocythemia.  His hydroxyurea at 500 mg is able to stabilize platelet counts adequately at this point.  Continue on the current dose. 2. Thrombosis prophylaxis.  Continue to be on low-dose aspirin.   3. Follow up visit in 6 months, at which time CBC with differential and CMET will be performed.    [facility], MD 7/30/20139:46 AM

## 2011-12-25 NOTE — Telephone Encounter (Signed)
appts made and printed for pt aom °

## 2012-01-04 ENCOUNTER — Encounter: Payer: Self-pay | Admitting: Physician Assistant

## 2012-05-23 ENCOUNTER — Telehealth: Payer: Self-pay | Admitting: Oncology

## 2012-05-23 NOTE — Telephone Encounter (Signed)
s.w. pt and advised on d.t change of appt.....pt ok and aware °

## 2012-06-26 ENCOUNTER — Ambulatory Visit: Payer: Medicare Other | Admitting: Oncology

## 2012-06-26 ENCOUNTER — Other Ambulatory Visit: Payer: Medicare Other | Admitting: Lab

## 2012-07-11 ENCOUNTER — Other Ambulatory Visit: Payer: Medicare Other

## 2012-07-11 ENCOUNTER — Ambulatory Visit: Payer: Medicare Other | Admitting: Oncology

## 2012-09-12 ENCOUNTER — Other Ambulatory Visit: Payer: Self-pay | Admitting: Oncology

## 2013-05-17 ENCOUNTER — Other Ambulatory Visit: Payer: Self-pay | Admitting: Oncology

## 2013-10-27 ENCOUNTER — Other Ambulatory Visit: Payer: Self-pay | Admitting: Oncology

## 2014-01-20 ENCOUNTER — Other Ambulatory Visit: Payer: Self-pay | Admitting: Oncology

## 2014-07-05 ENCOUNTER — Telehealth: Payer: Self-pay | Admitting: *Deleted

## 2014-07-05 ENCOUNTER — Other Ambulatory Visit: Payer: Self-pay | Admitting: Oncology

## 2014-07-05 DIAGNOSIS — D471 Chronic myeloproliferative disease: Secondary | ICD-10-CM

## 2014-07-05 NOTE — Telephone Encounter (Signed)
rec'd refill request for hydroxyurea. Patient had cancelled last 2 appts for lab/dr. States it snowed and he could not make it in. States he took his last pill today. Will send pof to schedulers for 1st available. Note to dr Hazeline Junker desk.

## 2014-07-05 NOTE — Telephone Encounter (Signed)
Please do not refill. Note to dr Hazeline Junker desk, asking if okay to refill before next visit. Patient cancelled last 2 visits. Alyse Kathan rn ocn

## 2014-07-06 ENCOUNTER — Telehealth: Payer: Self-pay | Admitting: Oncology

## 2014-07-06 ENCOUNTER — Other Ambulatory Visit: Payer: Self-pay | Admitting: *Deleted

## 2014-07-06 MED ORDER — HYDROXYUREA 500 MG PO CAPS: 500.0000 mg | ORAL_CAPSULE | Freq: Every day | ORAL | Status: DC

## 2014-07-06 NOTE — Telephone Encounter (Signed)
per pof to sch 1st avai-cld & spoke to pt gave pt appt time & date-pt understood

## 2014-07-06 NOTE — Telephone Encounter (Signed)
Spoke with patient, hydroxyurea e-scribed. Encouraged patient to keep regularly scheduled appt. States he will if it doesn't snow.

## 2014-07-08 ENCOUNTER — Ambulatory Visit (HOSPITAL_BASED_OUTPATIENT_CLINIC_OR_DEPARTMENT_OTHER): Payer: Medicare Other | Admitting: Oncology

## 2014-07-08 ENCOUNTER — Telehealth: Payer: Self-pay | Admitting: Oncology

## 2014-07-08 ENCOUNTER — Other Ambulatory Visit (HOSPITAL_BASED_OUTPATIENT_CLINIC_OR_DEPARTMENT_OTHER): Payer: Medicare Other

## 2014-07-08 VITALS — BP 114/56 | HR 49 | Temp 98.0°F | Resp 18 | Ht 69.0 in | Wt 161.2 lb

## 2014-07-08 DIAGNOSIS — D75839 Thrombocytosis, unspecified: Secondary | ICD-10-CM

## 2014-07-08 DIAGNOSIS — D471 Chronic myeloproliferative disease: Secondary | ICD-10-CM

## 2014-07-08 DIAGNOSIS — D473 Essential (hemorrhagic) thrombocythemia: Secondary | ICD-10-CM

## 2014-07-08 LAB — COMPREHENSIVE METABOLIC PANEL (CC13)
ALBUMIN: 4.1 g/dL (ref 3.5–5.0)
ALT: 15 U/L (ref 0–55)
AST: 23 U/L (ref 5–34)
Alkaline Phosphatase: 70 U/L (ref 40–150)
Anion Gap: 9 mEq/L (ref 3–11)
BUN: 10.9 mg/dL (ref 7.0–26.0)
CALCIUM: 9.2 mg/dL (ref 8.4–10.4)
CHLORIDE: 105 meq/L (ref 98–109)
CO2: 27 mEq/L (ref 22–29)
Creatinine: 1.1 mg/dL (ref 0.7–1.3)
EGFR: 64 mL/min/{1.73_m2} — ABNORMAL LOW (ref >90)
Glucose: 90 mg/dl (ref 70–140)
POTASSIUM: 4.3 meq/L (ref 3.5–5.1)
Sodium: 141 mEq/L (ref 136–145)
Total Bilirubin: 1.15 mg/dL (ref 0.20–1.20)
Total Protein: 7.1 g/dL (ref 6.4–8.3)

## 2014-07-08 LAB — CBC WITH DIFFERENTIAL/PLATELET
BASO%: 0.5 % (ref 0.0–2.0)
BASOS ABS: 0 10*3/uL (ref 0.0–0.1)
EOS%: 3.9 % (ref 0.0–7.0)
Eosinophils Absolute: 0.2 10*3/uL (ref 0.0–0.5)
HCT: 44.3 % (ref 38.4–49.9)
HEMOGLOBIN: 14.6 g/dL (ref 13.0–17.1)
LYMPH#: 1.3 10*3/uL (ref 0.9–3.3)
LYMPH%: 22.1 % (ref 14.0–49.0)
MCH: 32.7 pg (ref 27.2–33.4)
MCHC: 32.9 g/dL (ref 32.0–36.0)
MCV: 99.6 fL — ABNORMAL HIGH (ref 79.3–98.0)
MONO#: 0.5 10*3/uL (ref 0.1–0.9)
MONO%: 8 % (ref 0.0–14.0)
NEUT#: 3.9 10*3/uL (ref 1.5–6.5)
NEUT%: 65.5 % (ref 39.0–75.0)
Platelets: 542 10*3/uL — ABNORMAL HIGH (ref 140–400)
RBC: 4.45 10*6/uL (ref 4.20–5.82)
RDW: 15 % — AB (ref 11.0–14.6)
WBC: 5.9 10*3/uL (ref 4.0–10.3)

## 2014-07-08 NOTE — Progress Notes (Signed)
Hematology and Oncology Follow Up Visit  Nicholas Contreras [id number] 1929-03-09 81 y.o. 07/08/2014 9:13 AM Maximino Greenland, MDSanders, Robyn, MD   PRINCIPAL DIAGNOSIS:  This is an 81 year old gentleman with myeloproliferative disorder presenting with thrombocytosis, JAK2 positive.  He had a bone marrow biopsy in March 2011.  CURRENT THERAPY:  Hydroxyurea 500 mg daily to keep his platelet count around 400,000.  HISTORY OF PRESENT ILLNESS:  Nicholas Contreras presents today for a followup visit.  Since the last visit, he reports no new complaints. He had missed a few appointments the last 2 years and here to reestablish care. He has continued to tolerate hydroxyurea without any major toxicity. He ran out the last few days and he is getting his medication filled. He does not report any nausea,vomiting or GI toxicity to speak of at this point.  He is not reporting any changes in his performance status or activity level at this time. No  Bleeding or lymphadenopathy. He does not report any neurological symptoms or headaches.  He does report some occasional fatigue, but otherwise performance status and activity level remains excellent. The remaining review of systems unremarkable.   Medications: Current outpatient prescriptions:  .  aspirin 81 MG tablet, Take 81 mg by mouth daily., Disp: , Rfl:  .  atenolol (TENORMIN) 50 MG tablet, Take 50 mg by mouth daily., Disp: , Rfl:  .  enalapril (VASOTEC) 20 MG tablet, Take 20 mg by mouth daily., Disp: , Rfl:  .  ergocalciferol (VITAMIN D2) 50000 UNITS capsule, Take 50,000 Units by mouth once a week., Disp: , Rfl:  .  felodipine (PLENDIL) 10 MG 24 hr tablet, Take 10 mg by mouth daily., Disp: , Rfl:  .  hydrochlorothiazide (HYDRODIURIL) 12.5 MG tablet, Take 12.5 mg by mouth daily., Disp: , Rfl:  .  hydroxyurea (HYDREA) 500 MG capsule, Take 1 capsule (500 mg total) by mouth daily. May take with food to minimize GI side effects., Disp: 90 capsule, Rfl: 0 .  lovastatin  (MEVACOR) 20 MG tablet, Take 20 mg by mouth 2 (two) times daily., Disp: , Rfl:  .  Tamsulosin HCl (FLOMAX) 0.4 MG CAPS, Take 0.4 mg by mouth daily., Disp: , Rfl:   Allergies: No Known Allergies  Past Medical History, Surgical history, Social history, and Family History were reviewed and updated.    Physical Exam: Blood pressure 114/56, pulse 49, temperature 98 F (36.7 C), temperature source Oral, resp. rate 18, height '5\' 9"'  (1.753 m), weight 161 lb 3.2 oz (73.12 kg).  General:  Alert, awake gentleman appeared in no active distress. HEENT:  Head is normocephalic, atraumatic.  Pupils equal, round, reactive to light.   Oral mucosa moist and pink.   Neck:  Supple without lymphadenopathy.   Heart:  Regular rate and rhythm.  S1 and S2.   Lungs:  Clear to auscultation.  No rhonchi or wheezes.  No dullness to percussion.   Abdomen:  Soft, nontender.  No hepatosplenomegaly.   Extremities:  No edema. Skin: Showed no rashes or lesions.  Lab Results:   Recent Labs Lab 07/08/14 0852  WBC 5.9  HGB 14.6  HCT 44.3  PLT 542*  MCV 99.6*  MCH 32.7  MCHC 32.9  RDW 15.0*  LYMPHSABS 1.3  MONOABS 0.5  EOSABS 0.2  BASOSABS 0.0      Impression and Plan: 1. Essential thrombocythemia.  His hydroxyurea at 500 mg is able to stabilize platelet counts adequately at this point.  His platelet count may be slightly elevated  because you missed a few doses. I plan on continuing the current dose and schedule.  2. Thrombosis prophylaxis.  Continue to be on low-dose aspirin.   3. Follow up visit in 6 months, at which time CBC with differential and CMET will be performed.    [facility], MD 2/11/20169:13 AM

## 2014-07-08 NOTE — Telephone Encounter (Signed)
Gave avs & calendar for August °

## 2014-10-12 ENCOUNTER — Other Ambulatory Visit: Payer: Self-pay | Admitting: Oncology

## 2014-10-12 DIAGNOSIS — D75839 Thrombocytosis, unspecified: Secondary | ICD-10-CM

## 2014-10-12 DIAGNOSIS — D473 Essential (hemorrhagic) thrombocythemia: Secondary | ICD-10-CM | POA: Insufficient documentation

## 2015-01-06 ENCOUNTER — Ambulatory Visit (HOSPITAL_BASED_OUTPATIENT_CLINIC_OR_DEPARTMENT_OTHER): Payer: Medicare Other | Admitting: Oncology

## 2015-01-06 ENCOUNTER — Telehealth: Payer: Self-pay | Admitting: Oncology

## 2015-01-06 ENCOUNTER — Other Ambulatory Visit (HOSPITAL_BASED_OUTPATIENT_CLINIC_OR_DEPARTMENT_OTHER): Payer: Medicare Other

## 2015-01-06 VITALS — BP 108/58 | HR 53 | Temp 98.1°F | Resp 18 | Ht 69.0 in | Wt 167.6 lb

## 2015-01-06 DIAGNOSIS — D473 Essential (hemorrhagic) thrombocythemia: Secondary | ICD-10-CM

## 2015-01-06 DIAGNOSIS — D75839 Thrombocytosis, unspecified: Secondary | ICD-10-CM

## 2015-01-06 LAB — COMPREHENSIVE METABOLIC PANEL (CC13)
ALT: 24 U/L (ref 0–55)
ANION GAP: 5 meq/L (ref 3–11)
AST: 21 U/L (ref 5–34)
Albumin: 3.8 g/dL (ref 3.5–5.0)
Alkaline Phosphatase: 60 U/L (ref 40–150)
BUN: 10 mg/dL (ref 7.0–26.0)
CALCIUM: 8.7 mg/dL (ref 8.4–10.4)
CHLORIDE: 105 meq/L (ref 98–109)
CO2: 27 mEq/L (ref 22–29)
CREATININE: 1.1 mg/dL (ref 0.7–1.3)
EGFR: 60 mL/min/{1.73_m2} — ABNORMAL LOW (ref >90)
GLUCOSE: 95 mg/dL (ref 70–140)
Potassium: 4.5 mEq/L (ref 3.5–5.1)
Sodium: 137 mEq/L (ref 136–145)
Total Bilirubin: 0.98 mg/dL (ref 0.20–1.20)
Total Protein: 6.6 g/dL (ref 6.4–8.3)

## 2015-01-06 LAB — CBC WITH DIFFERENTIAL/PLATELET
BASO%: 0.5 % (ref 0.0–2.0)
Basophils Absolute: 0 10*3/uL (ref 0.0–0.1)
EOS ABS: 0.2 10*3/uL (ref 0.0–0.5)
EOS%: 4.3 % (ref 0.0–7.0)
HCT: 40 % (ref 38.4–49.9)
HGB: 13.7 g/dL (ref 13.0–17.1)
LYMPH%: 23.1 % (ref 14.0–49.0)
MCH: 34.1 pg — ABNORMAL HIGH (ref 27.2–33.4)
MCHC: 34.3 g/dL (ref 32.0–36.0)
MCV: 99.5 fL — ABNORMAL HIGH (ref 79.3–98.0)
MONO#: 0.3 10*3/uL (ref 0.1–0.9)
MONO%: 7.9 % (ref 0.0–14.0)
NEUT%: 64.2 % (ref 39.0–75.0)
NEUTROS ABS: 2.7 10*3/uL (ref 1.5–6.5)
PLATELETS: 318 10*3/uL (ref 140–400)
RBC: 4.02 10*6/uL — ABNORMAL LOW (ref 4.20–5.82)
RDW: 14.2 % (ref 11.0–14.6)
WBC: 4.2 10*3/uL (ref 4.0–10.3)
lymph#: 1 10*3/uL (ref 0.9–3.3)

## 2015-01-06 NOTE — Progress Notes (Signed)
Hematology and Oncology Follow Up Visit  Nicholas Contreras [id number] 06-17-1928 81 y.o. 01/06/2015 9:36 AM Maximino Greenland, MDSanders, Robyn, MD   PRINCIPAL DIAGNOSIS:  This is an 81 year old gentleman with myeloproliferative disorder presenting with thrombocytosis, JAK2 positive.  He had a bone marrow biopsy in March 2011.  CURRENT THERAPY:  Hydroxyurea 500 mg daily to keep his platelet count around 400,000.  HISTORY OF PRESENT ILLNESS:  [name] presents today for a followup visit.  Since the last visit, he continues to do very well without any problems.  He has continued to tolerate hydroxyurea without any major toxicity. He is adherent to this medication at this time and has not missed any doses.  He does not report any nausea,vomiting or GI toxicity to speak of at this point.  He is not reporting any changes in his performance status or activity level at this time. No  Bleeding or lymphadenopathy. He does not report any neurological symptoms or headaches, blurry vision or neuropathy. He does not report any chest pain, palpitation, orthopnea or leg edema. He does not report any cough, hemoptysis or wheezing. He does not report any abdominal pain, and satiety, constipation or bleeding per rectum. He does not report any frequency, urgency or hesitancy. He does not report any skeletal complaints. Remaining review of systems unremarkable.   Medications: Current outpatient prescriptions:  .  aspirin 81 MG tablet, Take 81 mg by mouth daily., Disp: , Rfl:  .  atenolol (TENORMIN) 50 MG tablet, Take 50 mg by mouth daily., Disp: , Rfl:  .  enalapril (VASOTEC) 20 MG tablet, Take 20 mg by mouth daily., Disp: , Rfl:  .  ergocalciferol (VITAMIN D2) 50000 UNITS capsule, Take 50,000 Units by mouth once a week., Disp: , Rfl:  .  felodipine (PLENDIL) 10 MG 24 hr tablet, Take 10 mg by mouth daily., Disp: , Rfl:  .  hydrochlorothiazide (HYDRODIURIL) 12.5 MG tablet, Take 12.5 mg by mouth daily., Disp: , Rfl:  .   hydroxyurea (HYDREA) 500 MG capsule, TAKE 1 CAPSULE DAILY WITH FOOD TO MINIMIZE GASTROINTESTINAL SIDE EFFECTS., Disp: 90 capsule, Rfl: 1 .  pravastatin (PRAVACHOL) 80 MG tablet, Take 80 mg by mouth daily., Disp: , Rfl:  .  Tamsulosin HCl (FLOMAX) 0.4 MG CAPS, Take 0.4 mg by mouth daily., Disp: , Rfl:   Allergies: No Known Allergies  Past Medical History, Surgical history, Social history, and Family History were reviewed and updated.    Physical Exam: Blood pressure 108/58, pulse 53, temperature 98.1 F (36.7 C), temperature source Oral, resp. rate 18, height '5\' 9"'  (1.753 m), weight 167 lb 9.6 oz (76.023 kg), SpO2 97 %.  General:  Alert, awake gentleman , cooperative not in distress  HEENT:  Head is normocephalic, atraumatic.  Pupils equal, round, reactive to light.  Sclerae anicteric.  Oral mucosa moist and pink.  No oral thrush noted.  Neck:  Supple without lymphadenopathy.   Heart:  Regular rate and rhythm.  S1 and S2.   Lungs:  Clear to auscultation.  No rhonchi or wheezes.  No dullness to percussion.   Abdomen:  Soft, nontender.  No hepatosplenomegaly.   Extremities:  No edema. Skin: Showed no rashes or lesions. Neurological: No deficits noted.  Lab Results:   Recent Labs Lab 01/06/15 0916  WBC 4.2  HGB 13.7  HCT 40.0  PLT 318  MCV 99.5*  MCH 34.1*  MCHC 34.3  RDW 14.2  LYMPHSABS 1.0  MONOABS 0.3  EOSABS 0.2  BASOSABS 0.0  Impression and Plan: 1. Essential thrombocythemia. He was diagnosed in 2011 after presenting with elevated platelet count and JAK2 mutation positive. His bone marrow biopsy at that time confirmed the diagnosis. He has been on hydroxyurea 500 mg daily without any complications. His platelet count today is a 318 and within the appropriate range. He has no complications associated with this medication and I have recommended continuing him on the same dose and schedule. I educated her about the potential late complications associated with  hydroxyurea including rarely hematological malignancy such as leukemia. At his age, and his risk of thrombosis it would be reasonable to continue hydroxyurea at this time.  2. Thrombosis prophylaxis.  Continue to be on low-dose aspirin.  He has not reported any bleeding or thrombosis episodes.  3. Follow up visit in 6 months, at which time CBC with differential and CMET will be performed.    [facility], MD 8/11/20169:36 AM

## 2015-01-06 NOTE — Telephone Encounter (Signed)
Pt confirmed labs/ov per 08/11 POF, gave pt avs and calendar.... KJ °

## 2015-02-21 ENCOUNTER — Other Ambulatory Visit: Payer: Self-pay | Admitting: Internal Medicine

## 2015-04-19 ENCOUNTER — Other Ambulatory Visit: Payer: Self-pay | Admitting: Oncology

## 2015-06-14 ENCOUNTER — Telehealth: Payer: Self-pay | Admitting: Oncology

## 2015-06-14 NOTE — Telephone Encounter (Signed)
Spoke to patient about appointment change 2/15 to 2/22. Appointment sent via mail

## 2015-07-13 ENCOUNTER — Other Ambulatory Visit: Payer: Medicare Other

## 2015-07-13 ENCOUNTER — Ambulatory Visit: Payer: Medicare Other | Admitting: Oncology

## 2015-07-20 ENCOUNTER — Other Ambulatory Visit: Payer: Self-pay | Admitting: *Deleted

## 2015-07-20 ENCOUNTER — Other Ambulatory Visit (HOSPITAL_BASED_OUTPATIENT_CLINIC_OR_DEPARTMENT_OTHER): Payer: Medicare Other

## 2015-07-20 ENCOUNTER — Telehealth: Payer: Self-pay | Admitting: Oncology

## 2015-07-20 ENCOUNTER — Other Ambulatory Visit: Payer: Medicare Other

## 2015-07-20 ENCOUNTER — Ambulatory Visit (HOSPITAL_BASED_OUTPATIENT_CLINIC_OR_DEPARTMENT_OTHER): Payer: Medicare Other | Admitting: Oncology

## 2015-07-20 VITALS — BP 120/65 | HR 55 | Temp 97.5°F | Resp 17 | Ht 69.0 in | Wt 179.1 lb

## 2015-07-20 DIAGNOSIS — D473 Essential (hemorrhagic) thrombocythemia: Secondary | ICD-10-CM

## 2015-07-20 DIAGNOSIS — D75839 Thrombocytosis, unspecified: Secondary | ICD-10-CM

## 2015-07-20 LAB — COMPREHENSIVE METABOLIC PANEL
ALT: 19 U/L (ref 0–55)
ANION GAP: 8 meq/L (ref 3–11)
AST: 23 U/L (ref 5–34)
Albumin: 3.8 g/dL (ref 3.5–5.0)
Alkaline Phosphatase: 44 U/L (ref 40–150)
BILIRUBIN TOTAL: 0.76 mg/dL (ref 0.20–1.20)
BUN: 11 mg/dL (ref 7.0–26.0)
CALCIUM: 9 mg/dL (ref 8.4–10.4)
CO2: 23 meq/L (ref 22–29)
CREATININE: 1.1 mg/dL (ref 0.7–1.3)
Chloride: 103 mEq/L (ref 98–109)
EGFR: 62 mL/min/{1.73_m2} — AB (ref >90)
Glucose: 105 mg/dl (ref 70–140)
Potassium: 4.2 mEq/L (ref 3.5–5.1)
Sodium: 134 mEq/L — ABNORMAL LOW (ref 136–145)
Total Protein: 7.1 g/dL (ref 6.4–8.3)

## 2015-07-20 LAB — CBC WITH DIFFERENTIAL/PLATELET
BASO%: 0.6 % (ref 0.0–2.0)
BASOS ABS: 0 10*3/uL (ref 0.0–0.1)
EOS%: 4.3 % (ref 0.0–7.0)
Eosinophils Absolute: 0.2 10*3/uL (ref 0.0–0.5)
HEMATOCRIT: 40.6 % (ref 38.4–49.9)
HGB: 13.8 g/dL (ref 13.0–17.1)
LYMPH#: 1.2 10*3/uL (ref 0.9–3.3)
LYMPH%: 22.4 % (ref 14.0–49.0)
MCH: 32.5 pg (ref 27.2–33.4)
MCHC: 34 g/dL (ref 32.0–36.0)
MCV: 95.8 fL (ref 79.3–98.0)
MONO#: 0.4 10*3/uL (ref 0.1–0.9)
MONO%: 7.2 % (ref 0.0–14.0)
NEUT#: 3.5 10*3/uL (ref 1.5–6.5)
NEUT%: 65.5 % (ref 39.0–75.0)
PLATELETS: 365 10*3/uL (ref 140–400)
RBC: 4.24 10*6/uL (ref 4.20–5.82)
RDW: 14.1 % (ref 11.0–14.6)
WBC: 5.4 10*3/uL (ref 4.0–10.3)

## 2015-07-20 MED ORDER — HYDROXYUREA 500 MG PO CAPS: ORAL_CAPSULE | ORAL | Status: DC

## 2015-07-20 NOTE — Telephone Encounter (Signed)
Pt confirmed labs/ov per 02/22 POF, gave pt AVS and Calendar...... KJ °

## 2015-07-20 NOTE — Progress Notes (Signed)
Hematology and Oncology Follow Up Visit  Nicholas Contreras [id number] 02/18/1929 82 y.o. 07/20/2015 10:23 AM Nicholas Contreras, MDSanders, Robyn, MD   PRINCIPAL DIAGNOSIS:  This is an 83 year old gentleman with myeloproliferative disorder presenting with thrombocytosis, JAK2 positive.  He had a bone marrow biopsy in March 2011.  CURRENT THERAPY:  Hydroxyurea 500 mg daily to keep his platelet count around 400,000.  HISTORY OF PRESENT ILLNESS:  Nicholas Contreras presents today for a followup visit. Since the last visit, he reports no changes in his health. He continues to ambulate and dry without any difficulties. His appetite remains excellent without any decline in his performance status.  He has continued to tolerate hydroxyurea without any major toxicity. He is adherent to this medication at this time and has not missed any doses.  He does not report any nausea,vomiting or GI toxicity to speak of at this point. He denied any constitutional symptoms of fevers, chills or sweats or early satiety. Has not reported any recent hospitalization or illnesses.   He does not report any neurological symptoms or headaches, blurry vision or neuropathy. He does not report any chest pain, palpitation, orthopnea or leg edema. He does not report any cough, hemoptysis or wheezing. He does not report any abdominal pain, and satiety, constipation or bleeding per rectum. He does not report any frequency, urgency or hesitancy. He does not report any skeletal complaints. Remaining review of systems unremarkable.   Medications: Current outpatient prescriptions:  .  aspirin 81 MG tablet, Take 81 mg by mouth daily., Disp: , Rfl:  .  atenolol (TENORMIN) 50 MG tablet, Take 50 mg by mouth daily., Disp: , Rfl:  .  enalapril (VASOTEC) 20 MG tablet, Take 20 mg by mouth daily., Disp: , Rfl:  .  ergocalciferol (VITAMIN D2) 50000 UNITS capsule, Take 50,000 Units by mouth once a week., Disp: , Rfl:  .  felodipine (PLENDIL) 10 MG 24 hr  tablet, Take 10 mg by mouth daily., Disp: , Rfl:  .  hydrochlorothiazide (HYDRODIURIL) 12.5 MG tablet, Take 12.5 mg by mouth daily., Disp: , Rfl:  .  pravastatin (PRAVACHOL) 80 MG tablet, Take 80 mg by mouth daily., Disp: , Rfl:  .  Tamsulosin HCl (FLOMAX) 0.4 MG CAPS, Take 0.4 mg by mouth daily., Disp: , Rfl:  .  hydroxyurea (HYDREA) 500 MG capsule, TAKE 1 CAPSULE DAILY WITH FOOD TO MINIMIZE GASTROINTESTINAL SIDE EFFECTS, Disp: 90 capsule, Rfl: 1  Allergies: No Known Allergies  Past Medical History, Surgical history, Social history, and Family History were reviewed and updated.    Physical Exam: Blood pressure 120/65, pulse 55, temperature 97.5 F (36.4 C), temperature source Oral, resp. rate 17, height '5\' 9"'  (1.753 m), weight 179 lb 1.6 oz (81.239 kg), SpO2 99 %. ECOG 1 General:  Alert, awake gentleman without distress. HEENT:  Head is normocephalic, atraumatic. Oral ulcers without lesions. Neck:  Supple without lymphadenopathy.   Heart:  Regular rate and rhythm.  S1 and S2.   Lungs:  Clear to auscultation.  No rhonchi or wheezes.  No dullness to percussion.   Abdomen:  Soft, nontender.  No hepatosplenomegaly.  No shifting dullness or ascites. Extremities:  No edema. Skin: Showed no rashes or lesions. Neurological: No deficits noted.  Lab Results:   Recent Labs Lab 07/20/15 0959  WBC 5.4  HGB 13.8  HCT 40.6  PLT 365  MCV 95.8  MCH 32.5  MCHC 34.0  RDW 14.1  LYMPHSABS 1.2  MONOABS 0.4  EOSABS 0.2  BASOSABS 0.0  Impression and Plan:  82 year old gentleman with the following issues:   1. Essential thrombocythemia. He was diagnosed in 2011 after presenting with elevated platelet count and JAK2 mutation positive. His bone marrow biopsy at that time confirmed the diagnosis. He has been on hydroxyurea 500 mg daily without any complications. His laboratory data were reviewed today and appear to be within normal range including platelet, white cell count and red  cells. He does not report any constitutional symptoms or complications related to hydroxyurea. The plan is to continue with the same dose and schedule without interruptions or delay.  2. Thrombosis prophylaxis.  Continue to be on low-dose aspirin.  He has not reported any bleeding or thrombosis episodes.  3. Follow up visit in 6 months for laboratory testing as well as clinical examination.   [facility], MD 2/22/201710:23 AM

## 2016-01-18 ENCOUNTER — Telehealth: Payer: Self-pay | Admitting: Oncology

## 2016-01-18 ENCOUNTER — Other Ambulatory Visit (HOSPITAL_BASED_OUTPATIENT_CLINIC_OR_DEPARTMENT_OTHER): Payer: Medicare Other

## 2016-01-18 ENCOUNTER — Ambulatory Visit (HOSPITAL_BASED_OUTPATIENT_CLINIC_OR_DEPARTMENT_OTHER): Payer: Medicare Other | Admitting: Oncology

## 2016-01-18 VITALS — BP 113/65 | HR 60 | Temp 98.0°F | Resp 18 | Ht 69.0 in | Wt 178.6 lb

## 2016-01-18 DIAGNOSIS — D75839 Thrombocytosis, unspecified: Secondary | ICD-10-CM

## 2016-01-18 DIAGNOSIS — D473 Essential (hemorrhagic) thrombocythemia: Secondary | ICD-10-CM

## 2016-01-18 DIAGNOSIS — R609 Edema, unspecified: Secondary | ICD-10-CM

## 2016-01-18 LAB — COMPREHENSIVE METABOLIC PANEL
ALT: 17 U/L (ref 0–55)
AST: 25 U/L (ref 5–34)
Albumin: 3.8 g/dL (ref 3.5–5.0)
Alkaline Phosphatase: 48 U/L (ref 40–150)
Anion Gap: 10 mEq/L (ref 3–11)
BUN: 10 mg/dL (ref 7.0–26.0)
CO2: 22 meq/L (ref 22–29)
CREATININE: 1.2 mg/dL (ref 0.7–1.3)
Calcium: 9.3 mg/dL (ref 8.4–10.4)
Chloride: 104 mEq/L (ref 98–109)
EGFR: 55 mL/min/{1.73_m2} — AB (ref >90)
GLUCOSE: 94 mg/dL (ref 70–140)
Potassium: 4.2 mEq/L (ref 3.5–5.1)
Sodium: 136 mEq/L (ref 136–145)
TOTAL PROTEIN: 7.3 g/dL (ref 6.4–8.3)
Total Bilirubin: 0.98 mg/dL (ref 0.20–1.20)

## 2016-01-18 LAB — CBC WITH DIFFERENTIAL/PLATELET
BASO%: 0.7 % (ref 0.0–2.0)
BASOS ABS: 0 10*3/uL (ref 0.0–0.1)
EOS ABS: 0.2 10*3/uL (ref 0.0–0.5)
EOS%: 4 % (ref 0.0–7.0)
HEMATOCRIT: 40.9 % (ref 38.4–49.9)
HEMOGLOBIN: 13.9 g/dL (ref 13.0–17.1)
LYMPH#: 1.2 10*3/uL (ref 0.9–3.3)
LYMPH%: 20.5 % (ref 14.0–49.0)
MCH: 33 pg (ref 27.2–33.4)
MCHC: 34 g/dL (ref 32.0–36.0)
MCV: 97.1 fL (ref 79.3–98.0)
MONO#: 0.4 10*3/uL (ref 0.1–0.9)
MONO%: 7.5 % (ref 0.0–14.0)
NEUT#: 3.9 10*3/uL (ref 1.5–6.5)
NEUT%: 67.3 % (ref 39.0–75.0)
Platelets: 436 10*3/uL — ABNORMAL HIGH (ref 140–400)
RBC: 4.21 10*6/uL (ref 4.20–5.82)
RDW: 15 % — AB (ref 11.0–14.6)
WBC: 5.7 10*3/uL (ref 4.0–10.3)

## 2016-01-18 NOTE — Progress Notes (Signed)
Hematology and Oncology Follow Up Visit  JACOBE STUDY [id number] Apr 29, 1929 82 y.o. 01/18/2016 9:23 AM Maximino Greenland, MDSanders, Robyn, MD   PRINCIPAL DIAGNOSIS:  This is an 82 year old gentleman with myeloproliferative disorder presenting with thrombocytosis, JAK2 positive.  He had a bone marrow biopsy in March 2011.  CURRENT THERAPY:  Hydroxyurea 500 mg daily to keep his platelet count around 400,000.  HISTORY OF PRESENT ILLNESS:  Mr. Croft presents today for a followup visit. Since the last visit, he continues to be in reasonable health and shape. He has continued to tolerate hydroxyurea without any major toxicity. He denied any arthralgias, myalgias or oral ulcers. He is adherent to this medication at this time and has not missed any doses.  He does not report any nausea,vomiting or GI toxicity to speak of at this point.   He denied any constitutional symptoms of fevers, chills or sweats or early satiety. Has not reported any recent hospitalization or illnesses. He denied any bleeding or thrombotic episodes. He continues to enjoy a reasonable quality of life and attends to activities of daily living. He is ambulating, driving without any falls or syncope.   He does not report any neurological symptoms or headaches, blurry vision or neuropathy. He does not report any chest pain, palpitation, orthopnea but does report mild ankle edema. He does not report any cough, hemoptysis or wheezing. He does not report any abdominal pain, and satiety, constipation or bleeding per rectum. He does not report any frequency, urgency or hesitancy. He does not report any skeletal complaints. Remaining review of systems unremarkable.   Medications: Current Outpatient Prescriptions:  .  aspirin 81 MG tablet, Take 81 mg by mouth daily., Disp: , Rfl:  .  atenolol (TENORMIN) 50 MG tablet, Take 50 mg by mouth daily., Disp: , Rfl:  .  enalapril (VASOTEC) 20 MG tablet, Take 20 mg by mouth daily., Disp: , Rfl:  .   ergocalciferol (VITAMIN D2) 50000 UNITS capsule, Take 50,000 Units by mouth once a week., Disp: , Rfl:  .  felodipine (PLENDIL) 10 MG 24 hr tablet, Take 10 mg by mouth daily., Disp: , Rfl:  .  hydrochlorothiazide (HYDRODIURIL) 12.5 MG tablet, Take 12.5 mg by mouth daily., Disp: , Rfl:  .  hydroxyurea (HYDREA) 500 MG capsule, TAKE 1 CAPSULE DAILY WITH FOOD TO MINIMIZE GASTROINTESTINAL SIDE EFFECTS, Disp: 90 capsule, Rfl: 1 .  pravastatin (PRAVACHOL) 80 MG tablet, Take 80 mg by mouth daily., Disp: , Rfl:  .  Tamsulosin HCl (FLOMAX) 0.4 MG CAPS, Take 0.4 mg by mouth daily., Disp: , Rfl:   Allergies: No Known Allergies  Past Medical History, Surgical history, Social history, and Family History were reviewed and updated.    Physical Exam: Blood pressure 113/65, pulse 60, temperature 98 F (36.7 C), temperature source Oral, resp. rate 18, height '5\' 9"'  (1.753 m), weight 178 lb 9.6 oz (81 kg), SpO2 98 %. ECOG 1 General:  Well-appearing gentleman without distress. HEENT:  Head is normocephalic, atraumatic. No oral thrush or ulcers. Neck:  Supple without lymphadenopathy.   Heart:  Regular rate and rhythm.  S1 and S2.  No rubs or gallops. Lungs:  Clear to auscultation.  No rhonchi or wheezes.  No dullness to percussion.   Abdomen:  Soft, nontender.  No hepatosplenomegaly.  No rebound or guarding. Extremities:  No edema. Skin: Showed no rashes or lesions. Neurological: No deficits noted.  Lab Results:   Recent Labs Lab 01/18/16 0854  WBC 5.7  HGB 13.9  HCT  40.9  PLT 436*  MCV 97.1  MCH 33.0  MCHC 34.0  RDW 15.0*  LYMPHSABS 1.2  MONOABS 0.4  EOSABS 0.2  BASOSABS 0.0      Impression and Plan:  82 year old gentleman with the following issues:   1. Essential thrombocythemia. He was diagnosed in 2011 after presenting with elevated platelet count and JAK2 mutation positive. His bone marrow biopsy at that time confirmed the diagnosis. He has been on hydroxyurea 500 mg daily without  any complications. His platelet count remains under reasonable control although mildly elevated. The plan is to continue with the same dose and schedule given his reasonable tolerance and excellent platelet count. 2. Thrombosis prophylaxis.  Continue to be on low-dose aspirin. No complications related to this issue. 3. Follow up visit in 6 months for laboratory testing and physical examination.   [facility], MD 8/23/20179:23 AM

## 2016-01-18 NOTE — Telephone Encounter (Signed)
GAVE PATIENT AVS REPORT AND APPOINTMENTS FOR February. °

## 2016-02-07 ENCOUNTER — Other Ambulatory Visit: Payer: Self-pay | Admitting: *Deleted

## 2016-02-07 DIAGNOSIS — D473 Essential (hemorrhagic) thrombocythemia: Secondary | ICD-10-CM

## 2016-02-07 DIAGNOSIS — D75839 Thrombocytosis, unspecified: Secondary | ICD-10-CM

## 2016-02-07 MED ORDER — HYDROXYUREA 500 MG PO CAPS: ORAL_CAPSULE | ORAL | 5 refills | Status: DC

## 2016-02-22 ENCOUNTER — Encounter: Payer: Self-pay | Admitting: *Deleted

## 2016-02-23 ENCOUNTER — Other Ambulatory Visit: Payer: Self-pay | Admitting: *Deleted

## 2016-02-23 DIAGNOSIS — D75839 Thrombocytosis, unspecified: Secondary | ICD-10-CM

## 2016-02-23 DIAGNOSIS — D473 Essential (hemorrhagic) thrombocythemia: Secondary | ICD-10-CM

## 2016-02-23 MED ORDER — HYDROXYUREA 500 MG PO CAPS: ORAL_CAPSULE | ORAL | 5 refills | Status: DC

## 2016-07-17 ENCOUNTER — Other Ambulatory Visit (HOSPITAL_BASED_OUTPATIENT_CLINIC_OR_DEPARTMENT_OTHER): Payer: Medicare Other

## 2016-07-17 ENCOUNTER — Telehealth: Payer: Self-pay | Admitting: Oncology

## 2016-07-17 ENCOUNTER — Ambulatory Visit (HOSPITAL_BASED_OUTPATIENT_CLINIC_OR_DEPARTMENT_OTHER): Payer: Medicare Other | Admitting: Oncology

## 2016-07-17 VITALS — BP 122/74 | HR 54 | Temp 97.7°F | Resp 18 | Ht 69.0 in | Wt 179.7 lb

## 2016-07-17 DIAGNOSIS — D75839 Thrombocytosis, unspecified: Secondary | ICD-10-CM

## 2016-07-17 DIAGNOSIS — D473 Essential (hemorrhagic) thrombocythemia: Secondary | ICD-10-CM

## 2016-07-17 DIAGNOSIS — R609 Edema, unspecified: Secondary | ICD-10-CM | POA: Diagnosis not present

## 2016-07-17 LAB — COMPREHENSIVE METABOLIC PANEL
ALBUMIN: 4 g/dL (ref 3.5–5.0)
ALT: 19 U/L (ref 0–55)
AST: 24 U/L (ref 5–34)
Alkaline Phosphatase: 61 U/L (ref 40–150)
Anion Gap: 11 mEq/L (ref 3–11)
BUN: 12 mg/dL (ref 7.0–26.0)
CHLORIDE: 105 meq/L (ref 98–109)
CO2: 21 meq/L — AB (ref 22–29)
Calcium: 9.2 mg/dL (ref 8.4–10.4)
Creatinine: 1.2 mg/dL (ref 0.7–1.3)
EGFR: 55 mL/min/{1.73_m2} — ABNORMAL LOW (ref >90)
GLUCOSE: 98 mg/dL (ref 70–140)
POTASSIUM: 4.3 meq/L (ref 3.5–5.1)
SODIUM: 137 meq/L (ref 136–145)
Total Bilirubin: 0.83 mg/dL (ref 0.20–1.20)
Total Protein: 7.4 g/dL (ref 6.4–8.3)

## 2016-07-17 LAB — CBC WITH DIFFERENTIAL/PLATELET
BASO%: 0.7 % (ref 0.0–2.0)
BASOS ABS: 0 10*3/uL (ref 0.0–0.1)
EOS%: 4.5 % (ref 0.0–7.0)
Eosinophils Absolute: 0.3 10*3/uL (ref 0.0–0.5)
HCT: 41.4 % (ref 38.4–49.9)
HEMOGLOBIN: 14.3 g/dL (ref 13.0–17.1)
LYMPH%: 21.3 % (ref 14.0–49.0)
MCH: 34.6 pg — AB (ref 27.2–33.4)
MCHC: 34.6 g/dL (ref 32.0–36.0)
MCV: 100 fL — ABNORMAL HIGH (ref 79.3–98.0)
MONO#: 0.4 10*3/uL (ref 0.1–0.9)
MONO%: 7 % (ref 0.0–14.0)
NEUT#: 3.7 10*3/uL (ref 1.5–6.5)
NEUT%: 66.5 % (ref 39.0–75.0)
Platelets: 631 10*3/uL — ABNORMAL HIGH (ref 140–400)
RBC: 4.14 10*6/uL — AB (ref 4.20–5.82)
RDW: 14 % (ref 11.0–14.6)
WBC: 5.6 10*3/uL (ref 4.0–10.3)
lymph#: 1.2 10*3/uL (ref 0.9–3.3)

## 2016-07-17 NOTE — Progress Notes (Signed)
Hematology and Oncology Follow Up Visit  Nicholas Contreras [id number] [birth year] 83 y.o. 07/17/2016 8:45 AM Nicholas Contreras, MDSanders, Bailey Mech, MD   PRINCIPAL DIAGNOSIS: 83 year old gentleman with myeloproliferative disorder presenting with thrombocytosis, JAK2 positive.  He had a bone marrow biopsy in March 2011.  CURRENT THERAPY:  Hydroxyurea 500 mg daily to keep his platelet count close to 600,000.  HISTORY OF PRESENT ILLNESS:  Nicholas Contreras presents today for a followup visit. Since the last visit, he reports no major changes in his health. He continues to report reasonable quality of life and performance status although he appears to be slower in his mobility. He still able to drive short distances and able to make it to his appointments at this time.   He has continued to tolerate hydroxyurea without any major toxicity. He denied any arthralgias, myalgias or oral ulcers. He is adherent to this medication although he admits he missed a few days periodically. He denied any recent thrombosis or bleeding episodes. Has not reported any constitutional symptoms or recent hospitalizations. His appetite remain excellent and has not reported any weight loss or abdominal pain.   He does not report any neurological symptoms or headaches, blurry vision or neuropathy. He does not report any chest pain, palpitation, orthopnea but does report mild ankle edema. He does not report any cough, hemoptysis or wheezing. He does not report any abdominal pain, and satiety, constipation or bleeding per rectum. He does not report any frequency, urgency or hesitancy. He does not report any skeletal complaints. Remaining review of systems unremarkable.   Medications: Current Outpatient Prescriptions:  .  aspirin 81 MG tablet, Take 81 mg by mouth daily., Disp: , Rfl:  .  atenolol (TENORMIN) 50 MG tablet, Take 50 mg by mouth daily., Disp: , Rfl:  .  enalapril (VASOTEC) 20 MG tablet, Take 20 mg by mouth daily., Disp: , Rfl:  .   ergocalciferol (VITAMIN D2) 50000 UNITS capsule, Take 50,000 Units by mouth once a week., Disp: , Rfl:  .  felodipine (PLENDIL) 10 MG 24 hr tablet, Take 10 mg by mouth daily., Disp: , Rfl:  .  hydrochlorothiazide (HYDRODIURIL) 12.5 MG tablet, Take 12.5 mg by mouth daily., Disp: , Rfl:  .  hydroxyurea (HYDREA) 500 MG capsule, TAKE 1 CAPSULE DAILY WITH FOOD TO MINIMIZE GASTROINTESTINAL SIDE EFFECTS, Disp: 30 capsule, Rfl: 5 .  pravastatin (PRAVACHOL) 80 MG tablet, Take 80 mg by mouth daily., Disp: , Rfl:  .  Tamsulosin HCl (FLOMAX) 0.4 MG CAPS, Take 0.4 mg by mouth daily., Disp: , Rfl:   Allergies: No Known Allergies  Past Medical History, Surgical history, Social history, and Family History were reviewed and updated.    Physical Exam: Blood pressure 122/74, pulse (!) 54, temperature 97.7 F (36.5 C), temperature source Oral, resp. rate 18, height 5' 9" (1.753 m), weight 179 lb 11.2 oz (81.5 kg), SpO2 98 %. ECOG 1 General:  Elderly gentleman ambulating slowly without difficulties. HEENT:  Head is normocephalic, atraumatic. No oral thrush noted. Neck:  Supple without lymphadenopathy.   Heart:  Regular rate and rhythm.  S1 and S2.  No rubs or gallops. No murmurs noted. Lungs:  Clear to auscultation.  No rhonchi or wheezes.   Abdomen:  Soft, nontender.  No hepatosplenomegaly.  No shifting dullness or ascites. Extremities:  No edema. Skin: Showed no rashes or lesions. No ecchymosis noted. Neurological: No deficits noted.  Lab Results:   Recent Labs Lab 07/17/16 0817  WBC 5.6  HGB 14.3  HCT 41.4  PLT 631*  MCV 100.0*  MCH 34.6*  MCHC 34.6  RDW 14.0  LYMPHSABS 1.2  MONOABS 0.4  EOSABS 0.3  BASOSABS 0.0      Impression and plan:  83 year old gentleman with the following issues:  1. Essential thrombocythemia diagnosed in 2011. At that time he had an elevated platelet count and JAK2 mutation positive. His bone marrow biopsy at that time confirmed the diagnosis.  His family  on hydroxyurea 500 mg daily which she has tolerated well. His platelets have been rising rather slowly but remains asymptomatic.  I have opted to keep him on the same dose and schedule an increase has hydroxyurea only if his platelet count is above 800,000. I fear that increasing the hydroxyurea dose could result in more complications and will increase the dose if absolutely necessary.  2. Thrombosis prophylaxis: He continues to be on aspirin without any thrombosis or bleeding episodes.  3. Follow-up: Will be in 6 months.   [facility], MD 2/20/20188:45 AM

## 2016-07-17 NOTE — Telephone Encounter (Signed)
Appointments scheduled per 07/17/16 los. Patient was given a copy of the AVS report and appointment schedule per 07/17/16 los.

## 2017-01-15 ENCOUNTER — Ambulatory Visit (HOSPITAL_BASED_OUTPATIENT_CLINIC_OR_DEPARTMENT_OTHER): Payer: Medicare Other | Admitting: Oncology

## 2017-01-15 ENCOUNTER — Other Ambulatory Visit (HOSPITAL_BASED_OUTPATIENT_CLINIC_OR_DEPARTMENT_OTHER): Payer: Medicare Other

## 2017-01-15 ENCOUNTER — Telehealth: Payer: Self-pay | Admitting: Oncology

## 2017-01-15 VITALS — BP 115/58 | HR 56 | Temp 98.2°F | Resp 20 | Ht 69.0 in | Wt 177.0 lb

## 2017-01-15 DIAGNOSIS — R609 Edema, unspecified: Secondary | ICD-10-CM

## 2017-01-15 DIAGNOSIS — D473 Essential (hemorrhagic) thrombocythemia: Secondary | ICD-10-CM

## 2017-01-15 DIAGNOSIS — D75839 Thrombocytosis, unspecified: Secondary | ICD-10-CM

## 2017-01-15 LAB — COMPREHENSIVE METABOLIC PANEL
ALT: 15 U/L (ref 0–55)
AST: 22 U/L (ref 5–34)
Albumin: 3.7 g/dL (ref 3.5–5.0)
Alkaline Phosphatase: 48 U/L (ref 40–150)
Anion Gap: 8 mEq/L (ref 3–11)
BILIRUBIN TOTAL: 0.89 mg/dL (ref 0.20–1.20)
BUN: 11.4 mg/dL (ref 7.0–26.0)
CHLORIDE: 104 meq/L (ref 98–109)
CO2: 23 mEq/L (ref 22–29)
Calcium: 9.1 mg/dL (ref 8.4–10.4)
Creatinine: 1.2 mg/dL (ref 0.7–1.3)
EGFR: 52 mL/min/{1.73_m2} — AB (ref >90)
GLUCOSE: 99 mg/dL (ref 70–140)
Potassium: 3.8 mEq/L (ref 3.5–5.1)
Sodium: 135 mEq/L — ABNORMAL LOW (ref 136–145)
TOTAL PROTEIN: 6.8 g/dL (ref 6.4–8.3)

## 2017-01-15 LAB — CBC WITH DIFFERENTIAL/PLATELET
BASO%: 0.7 % (ref 0.0–2.0)
Basophils Absolute: 0 10*3/uL (ref 0.0–0.1)
EOS%: 4.3 % (ref 0.0–7.0)
Eosinophils Absolute: 0.2 10*3/uL (ref 0.0–0.5)
HCT: 38.3 % — ABNORMAL LOW (ref 38.4–49.9)
HGB: 12.9 g/dL — ABNORMAL LOW (ref 13.0–17.1)
LYMPH%: 21.7 % (ref 14.0–49.0)
MCH: 33.9 pg — ABNORMAL HIGH (ref 27.2–33.4)
MCHC: 33.7 g/dL (ref 32.0–36.0)
MCV: 100.8 fL — AB (ref 79.3–98.0)
MONO#: 0.5 10*3/uL (ref 0.1–0.9)
MONO%: 10.6 % (ref 0.0–14.0)
NEUT%: 62.7 % (ref 39.0–75.0)
NEUTROS ABS: 2.8 10*3/uL (ref 1.5–6.5)
Platelets: 391 10*3/uL (ref 140–400)
RBC: 3.8 10*6/uL — AB (ref 4.20–5.82)
RDW: 13.7 % (ref 11.0–14.6)
WBC: 4.4 10*3/uL (ref 4.0–10.3)
lymph#: 1 10*3/uL (ref 0.9–3.3)

## 2017-01-15 NOTE — Telephone Encounter (Signed)
Gave pt calendar for appt in Feb of 2019. Did not want avs

## 2017-01-15 NOTE — Progress Notes (Signed)
Hematology and Oncology Follow Up Visit  Nicholas Contreras [id number] 03/05/1929 83 y.o. 01/15/2017 9:17 AM Glendale Chard, MDSanders, Bailey Mech, MD   PRINCIPAL DIAGNOSIS: 83 year old gentleman with myeloproliferative disorder presenting with thrombocytosis, JAK2 positive.  He had a bone marrow biopsy in March 2011.  CURRENT THERAPY:  Hydroxyurea 500 mg daily to keep his platelet count close to 600,000.  HISTORY OF PRESENT ILLNESS:  Nicholas Contreras presents today for a followup visit. Since the last visit, he reports no recent complaints. He does have some residual changes related to macular degeneration but no other complaints. He continues to tolerate hydroxyurea without any major toxicity. He denied any arthralgias, myalgias or oral ulcers. He is adherent to this medication although he admits he missed a few days periodically. He denied any recent thrombosis or bleeding episodes. Has not reported any constitutional symptoms or recent hospitalizations. Quality of life and performance status not changed.   He does not report any neurological symptoms or headaches, blurry vision or neuropathy. He does not report any chest pain, palpitation, orthopnea but does report mild ankle edema. He does not report any cough, hemoptysis or wheezing. He does not report any abdominal pain, and satiety, constipation or bleeding per rectum. He does not report any frequency, urgency or hesitancy. He does not report any skeletal complaints. Remaining review of systems unremarkable.   Medications: Current Outpatient Prescriptions:  .  aspirin 81 MG tablet, Take 81 mg by mouth daily., Disp: , Rfl:  .  atenolol (TENORMIN) 50 MG tablet, Take 50 mg by mouth daily., Disp: , Rfl:  .  enalapril (VASOTEC) 20 MG tablet, Take 20 mg by mouth daily., Disp: , Rfl:  .  ergocalciferol (VITAMIN D2) 50000 UNITS capsule, Take 50,000 Units by mouth once a week., Disp: , Rfl:  .  felodipine (PLENDIL) 10 MG 24 hr tablet, Take 10 mg by mouth  daily., Disp: , Rfl:  .  hydrochlorothiazide (HYDRODIURIL) 12.5 MG tablet, Take 12.5 mg by mouth daily., Disp: , Rfl:  .  hydroxyurea (HYDREA) 500 MG capsule, TAKE 1 CAPSULE DAILY WITH FOOD TO MINIMIZE GASTROINTESTINAL SIDE EFFECTS, Disp: 30 capsule, Rfl: 5 .  pravastatin (PRAVACHOL) 80 MG tablet, Take 80 mg by mouth daily., Disp: , Rfl:  .  Tamsulosin HCl (FLOMAX) 0.4 MG CAPS, Take 0.4 mg by mouth daily., Disp: , Rfl:   Allergies: No Known Allergies  Past Medical History, Surgical history, Social history, and Family History were reviewed and updated.    Physical Exam: Blood pressure (!) 115/58, pulse (!) 56, temperature 98.2 F (36.8 C), temperature source Oral, resp. rate 20, height '5\' 9"'  (1.753 m), weight 177 lb (80.3 kg), SpO2 97 %. ECOG 1 General: Well-appearing gentleman without distress. HEENT:  Head is normocephalic, atraumatic. No oral ulcers or thrush. Neck:  Supple without lymphadenopathy.   Heart:  Regular rate and rhythm.  S1 and S2. Without murmurs. Lungs:  Clear to auscultation.  No rhonchi or wheezes.   Abdomen:  Soft, nontender.  No hepatosplenomegaly.  No rebound or guarding. Extremities:  No edema. Skin: Showed no rashes or lesions. Neurological: No deficits noted.  Lab Results:   Recent Labs Lab 01/15/17 0850  WBC 4.4  HGB 12.9*  HCT 38.3*  PLT 391  MCV 100.8*  MCH 33.9*  MCHC 33.7  RDW 13.7  LYMPHSABS 1.0  MONOABS 0.5  EOSABS 0.2  BASOSABS 0.0      Impression and plan:  83 year old gentleman with the following issues:  1. Essential thrombocythemia diagnosed in  2011. At that time he had an elevated platelet count and JAK2 mutation positive. His bone marrow biopsy at that time confirmed the diagnosis.  He is on hydroxyurea 500 mg daily which she has tolerated well.   His laboratory data were reviewed today and platelet count appears under excellent control. Risks and benefits of continuing this medication were reviewed and is agreeable to  continue.  2. Thrombosis prophylaxis: He continues to be on aspirin without any thrombosis or bleeding episodes.  3. Follow-up: Will be in 6 months.   [facility], MD 8/21/20189:17 AM

## 2017-05-14 ENCOUNTER — Other Ambulatory Visit: Payer: Self-pay | Admitting: Oncology

## 2017-05-14 DIAGNOSIS — D75839 Thrombocytosis, unspecified: Secondary | ICD-10-CM

## 2017-05-14 DIAGNOSIS — D473 Essential (hemorrhagic) thrombocythemia: Secondary | ICD-10-CM

## 2017-07-18 ENCOUNTER — Inpatient Hospital Stay: Payer: Medicare Other | Attending: Oncology | Admitting: Oncology

## 2017-07-18 ENCOUNTER — Inpatient Hospital Stay: Payer: Medicare Other

## 2017-07-18 ENCOUNTER — Telehealth: Payer: Self-pay | Admitting: Oncology

## 2017-07-18 VITALS — BP 128/86 | HR 71 | Temp 97.8°F | Resp 16 | Ht 69.0 in | Wt 175.4 lb

## 2017-07-18 DIAGNOSIS — D75839 Thrombocytosis, unspecified: Secondary | ICD-10-CM

## 2017-07-18 DIAGNOSIS — D473 Essential (hemorrhagic) thrombocythemia: Secondary | ICD-10-CM

## 2017-07-18 LAB — COMPREHENSIVE METABOLIC PANEL
ALBUMIN: 4.1 g/dL (ref 3.5–5.0)
ALT: 19 U/L (ref 0–55)
AST: 23 U/L (ref 5–34)
Alkaline Phosphatase: 60 U/L (ref 40–150)
Anion gap: 11 (ref 3–11)
BUN: 15 mg/dL (ref 7–26)
CO2: 24 mmol/L (ref 22–29)
Calcium: 9.7 mg/dL (ref 8.4–10.4)
Chloride: 102 mmol/L (ref 98–109)
Creatinine, Ser: 1.35 mg/dL — ABNORMAL HIGH (ref 0.70–1.30)
GFR calc non Af Amer: 46 mL/min — ABNORMAL LOW (ref >60)
GFR, EST AFRICAN AMERICAN: 53 mL/min — AB (ref >60)
GLUCOSE: 95 mg/dL (ref 70–140)
POTASSIUM: 4.3 mmol/L (ref 3.5–5.1)
SODIUM: 137 mmol/L (ref 136–145)
TOTAL PROTEIN: 7.8 g/dL (ref 6.4–8.3)
Total Bilirubin: 1.2 mg/dL (ref 0.2–1.2)

## 2017-07-18 LAB — CBC WITH DIFFERENTIAL/PLATELET
BASOS PCT: 0 %
Basophils Absolute: 0 10*3/uL (ref 0.0–0.1)
EOS ABS: 0.1 10*3/uL (ref 0.0–0.5)
EOS PCT: 2 %
HCT: 43.3 % (ref 38.4–49.9)
Hemoglobin: 14.4 g/dL (ref 13.0–17.1)
Lymphocytes Relative: 21 %
Lymphs Abs: 1.2 10*3/uL (ref 0.9–3.3)
MCH: 34.2 pg — ABNORMAL HIGH (ref 27.2–33.4)
MCHC: 33.3 g/dL (ref 32.0–36.0)
MCV: 102.9 fL — ABNORMAL HIGH (ref 79.3–98.0)
MONO ABS: 0.5 10*3/uL (ref 0.1–0.9)
MONOS PCT: 9 %
Neutro Abs: 3.9 10*3/uL (ref 1.5–6.5)
Neutrophils Relative %: 68 %
PLATELETS: 457 10*3/uL — AB (ref 140–400)
RBC: 4.21 MIL/uL (ref 4.20–5.82)
RDW: 13.9 % (ref 11.0–14.6)
WBC: 5.8 10*3/uL (ref 4.0–10.3)

## 2017-07-18 NOTE — Progress Notes (Signed)
Hematology and Oncology Follow Up Visit  Nicholas Contreras [id number] Jun 18, 1928 84 y.o. 07/18/2017 8:59 AM Glendale Chard, MDSanders, Bailey Mech, MD   PRINCIPAL DIAGNOSIS: 84 year old man with essential thrombocythemia myeloproliferative disorder presenting with thrombocytosis, JAK2 positive.  Confirmed by a bone marrow biopsy in 2011  CURRENT THERAPY:  Hydroxyurea 500 mg daily to keep his platelet count close to 600,000.  HISTORY OF PRESENT ILLNESS:  Nicholas Contreras presents is here for a follow-up.  He reports no major changes in his health and reports no recent complaints.  Continues to perform activities of daily living without any hindrance or decline.  He is slightly weaker in general but still attends to activities of daily living.  He denied complications of hydroxyurea including excessive fatigue, tiredness or mouth sores.  He denies any bleeding or thrombosis episodes.  He does not report any neurological symptoms or headaches, blurry vision or neuropathy. He does not report any chest pain, palpitation, orthopnea but does report improved edema.  He does not report any cough, hemoptysis or wheezing. He does not report any abdominal pain, and satiety, constipation or bleeding per rectum. He does not report any frequency, urgency or hesitancy. He does not report any skeletal complaints. Remaining review of systems is negative.  Medications: Current Outpatient Medications:  .  aspirin 81 MG tablet, Take 81 mg by mouth daily., Disp: , Rfl:  .  atenolol (TENORMIN) 50 MG tablet, Take 50 mg by mouth daily., Disp: , Rfl:  .  enalapril (VASOTEC) 20 MG tablet, Take 20 mg by mouth daily., Disp: , Rfl:  .  ergocalciferol (VITAMIN D2) 50000 UNITS capsule, Take 50,000 Units by mouth once a week., Disp: , Rfl:  .  felodipine (PLENDIL) 10 MG 24 hr tablet, Take 10 mg by mouth daily., Disp: , Rfl:  .  hydrochlorothiazide (HYDRODIURIL) 12.5 MG tablet, Take 12.5 mg by mouth daily., Disp: , Rfl:  .  hydroxyurea  (HYDREA) 500 MG capsule, TAKE 1 CAPSULE DAILY WITH FOOD TO MINIMIZE GASTROINTESTINAL SIDE EFFECTS, Disp: 90 capsule, Rfl: 4 .  pravastatin (PRAVACHOL) 80 MG tablet, Take 80 mg by mouth daily., Disp: , Rfl:  .  Tamsulosin HCl (FLOMAX) 0.4 MG CAPS, Take 0.4 mg by mouth daily., Disp: , Rfl:   Allergies: No Known Allergies  Past Medical History, Surgical history, Social history, and Family History were reviewed and updated.    Physical Exam: Blood pressure 128/86, pulse 71, temperature 97.8 F (36.6 C), temperature source Oral, resp. rate 16, height _0  (1.753 m), weight 175 lb 6.4 oz (79.6 kg), SpO2 99 %. ECOG 1 General: Alert, awake gentleman without distress. HEENT:  Head is normocephalic, atraumatic.  Oral mucosa without ulcers or bleeding.    Heart:  Regular rate and rhythm.  S1 and S2. Without murmurs. Lungs:  Clear to auscultation in all lung fields.  No rhonchi or wheezes.   Abdomen:  Soft, nontender, nondistended with good bowel sounds.  No hepatosplenomegaly.  No rebound or guarding. Skin: Showed no rashes or lesions.  No ecchymosis or petechiae. Neurological: No deficits noted.  Lab Results:  Recent Labs  Lab 07/18/17 0825  WBC 5.8  HGB 14.4  HCT 43.3  PLT 457*  MCV 102.9*  MCH 34.2*  MCHC 33.3  RDW 13.9  LYMPHSABS 1.2  MONOABS 0.5  EOSABS 0.1  BASOSABS 0.0      Impression and plan:  84 year old gentleman with the following issues:  1. Essential thrombocythemia mild proliferative disorder diagnosed in 2011.  He has JAK2  mutation positive with bone marrow biopsy confirmed these findings.    He is on hydroxyurea 500 mg daily without complications.  His platelet count continues to be under reasonable control with the current regimen.  The natural course of this disease was discussed today in detail.  Long-term complications associated with hydroxyurea were reviewed and and long-term complication associated with this condition also reviewed.  He has no  objections continuing hydroxyurea and the risk of long-term complication is low compared to rapid increase in his platelet count.  2. Thrombosis prophylaxis: No recent thrombosis or bleeding episodes.  He continues to be on low-dose aspirin.  3. Follow-up: Will be in 6 months.  15  minutes was spent with the patient face-to-face today.  More than 50% of time was dedicated to patient counseling, education and coordination of the patient's multifaceted care.    Zola Button, MD 2/21/20198:59 AM

## 2017-07-18 NOTE — Telephone Encounter (Signed)
Appointment scheduled AVS/Calendar printed per 2/21 LOS °

## 2018-01-15 ENCOUNTER — Inpatient Hospital Stay: Payer: Medicare Other | Attending: Oncology

## 2018-01-15 ENCOUNTER — Inpatient Hospital Stay: Payer: Medicare Other | Admitting: Oncology

## 2018-03-05 ENCOUNTER — Other Ambulatory Visit: Payer: Self-pay | Admitting: Internal Medicine

## 2018-04-09 ENCOUNTER — Ambulatory Visit: Payer: Self-pay | Admitting: Internal Medicine

## 2018-05-07 ENCOUNTER — Other Ambulatory Visit: Payer: Self-pay | Admitting: Internal Medicine

## 2018-05-07 DIAGNOSIS — D473 Essential (hemorrhagic) thrombocythemia: Secondary | ICD-10-CM

## 2018-05-07 DIAGNOSIS — D75839 Thrombocytosis, unspecified: Secondary | ICD-10-CM

## 2018-06-04 ENCOUNTER — Other Ambulatory Visit: Payer: Self-pay | Admitting: Oncology

## 2018-06-04 DIAGNOSIS — D473 Essential (hemorrhagic) thrombocythemia: Secondary | ICD-10-CM

## 2018-06-04 DIAGNOSIS — D75839 Thrombocytosis, unspecified: Secondary | ICD-10-CM

## 2018-06-16 ENCOUNTER — Other Ambulatory Visit: Payer: Self-pay | Admitting: Internal Medicine

## 2018-09-14 ENCOUNTER — Other Ambulatory Visit: Payer: Self-pay | Admitting: Internal Medicine

## 2018-11-03 ENCOUNTER — Other Ambulatory Visit: Payer: Self-pay | Admitting: Internal Medicine

## 2018-11-03 DIAGNOSIS — D75839 Thrombocytosis, unspecified: Secondary | ICD-10-CM

## 2018-11-03 DIAGNOSIS — D473 Essential (hemorrhagic) thrombocythemia: Secondary | ICD-10-CM

## 2018-12-02 ENCOUNTER — Other Ambulatory Visit: Payer: Self-pay

## 2018-12-02 ENCOUNTER — Ambulatory Visit (INDEPENDENT_AMBULATORY_CARE_PROVIDER_SITE_OTHER): Payer: Medicare Other

## 2018-12-02 VITALS — Ht 70.0 in | Wt 165.0 lb

## 2018-12-02 DIAGNOSIS — Z0000 Encounter for general adult medical examination without abnormal findings: Secondary | ICD-10-CM

## 2018-12-02 NOTE — Progress Notes (Signed)
Subjective:   Nicholas Contreras is a 85 y.o. male who presents for Medicare Annual/Subsequent preventive examination.  This visit type was conducted due to national recommendations for restrictions regarding the COVID-19 Pandemic (e.g. social distancing). This format is felt to be most appropriate for this patient at this time. All issues noted in this document were discussed and addressed. No physical exam was performed (except for noted visual exam findings with Video Visits). This patient, [name], has given permission to perform this visit via telephone. Vital signs may be absent or patient reported.  Patient location:  At home  Nurse location:  [place] office     Review of Systems:  n/a Cardiac Risk Factors include: advanced age (>38men, >10 women);hypertension;male gender;sedentary lifestyle     Objective:    Vitals: Ht 5\' 10"  (1.778 m) Comment: per patient  Wt 165 lb (74.8 kg) Comment: per patient  BMI 23.68 kg/m   Body mass index is 23.68 kg/m.  Advanced Directives 12/02/2018 07/17/2016 01/18/2016 07/20/2015  Does Patient Have a Medical Advance Directive? No No No No  Would patient like information on creating a medical advance directive? No - Patient declined - No - patient declined information No - patient declined information    Tobacco Social History   Tobacco Use  Smoking Status Never Smoker  Smokeless Tobacco Former Engineer, structural given: Not Answered   Clinical Intake:  Pre-visit preparation completed: Yes  Pain : No/denies pain     Nutritional Status: BMI of 19-24  Normal Nutritional Risks: None Diabetes: No  How often do you need to have someone help you when you read instructions, pamphlets, or other written materials from your doctor or pharmacy?: 4 - Often What is the last grade level you completed in school?: 12th grade  Interpreter Needed?: No  Information entered by :: NAllen LPN  Past Medical History:  Diagnosis Date  . BPH  (benign prostatic hyperplasia)   . Essential thrombocythemia ([place])   . Hyperlipidemia   . Hypertension   . Osteoporosis    Past Surgical History:  Procedure Laterality Date  . SKIN CANCER EXCISION     was on head    History reviewed. No pertinent family history. Social History   Socioeconomic History  . Marital status: Married    Spouse name: Not on file  . Number of children: Not on file  . Years of education: Not on file  . Highest education level: Not on file  Occupational History  . Not on file  Social Needs  . Financial resource strain: Not hard at all  . Food insecurity    Worry: Never true    Inability: Never true  . Transportation needs    Medical: No    Non-medical: No  Tobacco Use  . Smoking status: Never Smoker  . Smokeless tobacco: Former Network engineer and Sexual Activity  . Alcohol use: Not Currently  . Drug use: Never  . Sexual activity: Not Currently  Lifestyle  . Physical activity    Days per week: 0 days    Minutes per session: 0 min  . Stress: Not at all  Relationships  . Social Herbalist on phone: Not on file    Gets together: Not on file    Attends religious service: Not on file    Active member of club or organization: Not on file    Attends meetings of clubs or organizations: Not on file  Relationship status: Not on file  Other Topics Concern  . Not on file  Social History Narrative  . Not on file    Outpatient Encounter Medications as of 12/02/2018  Medication Sig  . aspirin 81 MG tablet Take 81 mg by mouth daily.  Marland Kitchen atenolol (TENORMIN) 50 MG tablet Take 50 mg by mouth daily.  . enalapril (VASOTEC) 20 MG tablet TAKE 1 TABLET TWICE A DAY  . ergocalciferol (VITAMIN D2) 50000 UNITS capsule Take 50,000 Units by mouth once a week.  . felodipine (PLENDIL) 10 MG 24 hr tablet TAKE 1 TABLET DAILY  . hydrochlorothiazide (HYDRODIURIL) 12.5 MG tablet Take 12.5 mg by mouth daily.  . hydroxyurea (HYDREA) 500 MG capsule TAKE 1  CAPSULE DAILY WITH FOOD TO MINIMIZE GASTROINTESTINAL SIDE EFFECTS  . pravastatin (PRAVACHOL) 80 MG tablet TAKE 1 TABLET DAILY  . Tamsulosin HCl (FLOMAX) 0.4 MG CAPS Take 0.4 mg by mouth daily.   No facility-administered encounter medications on file as of 12/02/2018.     Activities of Daily Living In your present state of health, do you have any difficulty performing the following activities: 12/02/2018  Hearing? N  Vision? Y  Difficulty concentrating or making decisions? N  Walking or climbing stairs? N  Dressing or bathing? N  Doing errands, shopping? Y  Comment someone is with him  Conservation officer, nature and eating ? N  Using the Toilet? N  In the past six months, have you accidently leaked urine? N  Do you have problems with loss of bowel control? N  Managing your Medications? N  Managing your Finances? N  Housekeeping or managing your Housekeeping? N  Some recent data might be hidden    Patient Care Team: Glendale Chard, MD as PCP - General (Internal Medicine) [facility]   Assessment:   This is a routine wellness examination for Nicholas Contreras.  Exercise Activities and Dietary recommendations Current Exercise Habits: The patient does not participate in regular exercise at present  Goals    . Patient Stated     No goals       Fall Risk Fall Risk  12/02/2018  Falls in the past year? 0  Risk for fall due to : Impaired vision;Medication side effect  Follow up Falls evaluation completed;Falls prevention discussed   Is the patient's home free of loose throw rugs in walkways, pet beds, electrical cords, etc?   yes      Grab bars in the bathroom? yes      Handrails on the stairs?   yes      Adequate lighting?   yes  Timed Get Up and Go Performed: n/a  Depression Screen PHQ 2/9 Scores 12/02/2018  PHQ - 2 Score 0  PHQ- 9 Score 0    Cognitive Function     6CIT Screen 12/02/2018  What Year? 0 points  What month? 0 points  What time? 0 points  Count back from 20 0  points  Months in reverse 2 points  Repeat phrase 2 points  Total Score 4    Immunization History  Administered Date(s) Administered  . Influenza, High Dose Seasonal PF 04/11/2018  . Influenza-Unspecified 04/28/2014    Qualifies for Shingles Vaccine? yes  Screening Tests Health Maintenance  Topic Date Due  . PNA vac Low Risk Adult (2 of 2 - PPSV23) 10/27/2014  . TETANUS/TDAP  12/02/2019 (Originally 12/20/1947)  . INFLUENZA VACCINE  12/27/2018   Cancer Screenings: Lung: Low Dose CT Chest recommended if Age 1-80 years, 40  pack-year currently smoking OR have quit w/in 15years. Patient does not qualify. Colorectal: not required  Additional Screenings:  Hepatitis C Screening:n/a      Plan:    6 CIT was 4. No goals set. States had Tetanus within 10 years.  I have personally reviewed and noted the following in the patient's chart:   . Medical and social history . Use of alcohol, tobacco or illicit drugs  . Current medications and supplements . Functional ability and status . Nutritional status . Physical activity . Advanced directives . List of other physicians . Hospitalizations, surgeries, and ER visits in previous 12 months . Vitals . Screenings to include cognitive, depression, and falls . Referrals and appointments  In addition, I have reviewed and discussed with patient certain preventive protocols, quality metrics, and best practice recommendations. A written personalized care plan for preventive services as well as general preventive health recommendations were provided to patient.     Kellie Simmering, LPN  01/02/1102

## 2018-12-02 NOTE — Patient Instructions (Addendum)
[name] , Thank you for taking time to come for your Medicare Wellness Visit. I appreciate your ongoing commitment to your health goals. Please review the following plan we discussed and let me know if I can assist you in the future.   Screening recommendations/referrals: Colonoscopy: not required Recommended yearly ophthamology/optometry visit for glaucoma screening and checkup Recommended yearly dental visit for hygiene and checkup  Vaccinations: Influenza vaccine: 03/2018 Pneumococcal vaccine: 10/2013 Tdap vaccine: had per patient Shingles vaccine:   discussed Advanced directives: Advance directive discussed with you today. Even though you declined this today please call our office should you change your mind and we can give you the proper paperwork for you to fill out.   Conditions/risks identified: none  Next appointment:   Preventive Care 83 Years and Older, Male Preventive care refers to lifestyle choices and visits with your health care provider that can promote health and wellness. What does preventive care include?  A yearly physical exam. This is also called an annual well check.  Dental exams once or twice a year.  Routine eye exams. Ask your health care provider how often you should have your eyes checked.  Personal lifestyle choices, including:  Daily care of your teeth and gums.  Regular physical activity.  Eating a healthy diet.  Avoiding tobacco and drug use.  Limiting alcohol use.  Practicing safe sex.  Taking low doses of aspirin every day.  Taking vitamin and mineral supplements as recommended by your health care provider. What happens during an annual well check? The services and screenings done by your health care provider during your annual well check will depend on your age, overall health, lifestyle risk factors, and family history of disease. Counseling  Your health care provider may ask you questions about your:  Alcohol use.   Tobacco use.  Drug use.  Emotional well-being.  Home and relationship well-being.  Sexual activity.  Eating habits.  History of falls.  Memory and ability to understand (cognition).  Work and work Statistician. Screening  You may have the following tests or measurements:  Height, weight, and BMI.  Blood pressure.  Lipid and cholesterol levels. These may be checked every 5 years, or more frequently if you are over 4 years old.  Skin check.  Lung cancer screening. You may have this screening every year starting at age 19 if you have a 30-pack-year history of smoking and currently smoke or have quit within the past 15 years.  Fecal occult blood test (FOBT) of the stool. You may have this test every year starting at age 16.  Flexible sigmoidoscopy or colonoscopy. You may have a sigmoidoscopy every 5 years or a colonoscopy every 10 years starting at age 95.  Prostate cancer screening. Recommendations will vary depending on your family history and other risks.  Hepatitis C blood test.  Hepatitis B blood test.  Sexually transmitted disease (STD) testing.  Diabetes screening. This is done by checking your blood sugar (glucose) after you have not eaten for a while (fasting). You may have this done every 1-3 years.  Abdominal aortic aneurysm (AAA) screening. You may need this if you are a current or former smoker.  Osteoporosis. You may be screened starting at age 29 if you are at high risk. Talk with your health care provider about your test results, treatment options, and if necessary, the need for more tests. Vaccines  Your health care provider may recommend certain vaccines, such as:  Influenza vaccine. This is recommended every year.  Tetanus, diphtheria, and acellular pertussis (Tdap, Td) vaccine. You may need a Td booster every 10 years.  Zoster vaccine. You may need this after age 82.  Pneumococcal 13-valent conjugate (PCV13) vaccine. One dose is recommended  after age 74.  Pneumococcal polysaccharide (PPSV23) vaccine. One dose is recommended after age 59. Talk to your health care provider about which screenings and vaccines you need and how often you need them. This information is not intended to replace advice given to you by your health care provider. Make sure you discuss any questions you have with your health care provider. Document Released: 06/10/2015 Document Revised: 02/01/2016 Document Reviewed: 03/15/2015 Elsevier Interactive Patient Education  2017 [place] Prevention in the Home Falls can cause injuries. They can happen to people of all ages. There are many things you can do to make your home safe and to help prevent falls. What can I do on the outside of my home?  Regularly fix the edges of walkways and driveways and fix any cracks.  Remove anything that might make you trip as you walk through a door, such as a raised step or threshold.  Trim any bushes or trees on the path to your home.  Use bright outdoor lighting.  Clear any walking paths of anything that might make someone trip, such as rocks or tools.  Regularly check to see if handrails are loose or broken. Make sure that both sides of any steps have handrails.  Any raised decks and porches should have guardrails on the edges.  Have any leaves, snow, or ice cleared regularly.  Use sand or salt on walking paths during winter.  Clean up any spills in your garage right away. This includes oil or grease spills. What can I do in the bathroom?  Use night lights.  Install grab bars by the toilet and in the tub and shower. Do not use towel bars as grab bars.  Use non-skid mats or decals in the tub or shower.  If you need to sit down in the shower, use a plastic, non-slip stool.  Keep the floor dry. Clean up any water that spills on the floor as soon as it happens.  Remove soap buildup in the tub or shower regularly.  Attach bath mats securely with  double-sided non-slip rug tape.  Do not have throw rugs and other things on the floor that can make you trip. What can I do in the bedroom?  Use night lights.  Make sure that you have a light by your bed that is easy to reach.  Do not use any sheets or blankets that are too big for your bed. They should not hang down onto the floor.  Have a firm chair that has side arms. You can use this for support while you get dressed.  Do not have throw rugs and other things on the floor that can make you trip. What can I do in the kitchen?  Clean up any spills right away.  Avoid walking on wet floors.  Keep items that you use a lot in easy-to-reach places.  If you need to reach something above you, use a strong step stool that has a grab bar.  Keep electrical cords out of the way.  Do not use floor polish or wax that makes floors slippery. If you must use wax, use non-skid floor wax.  Do not have throw rugs and other things on the floor that can make you trip. What can I do  with my stairs?  Do not leave any items on the stairs.  Make sure that there are handrails on both sides of the stairs and use them. Fix handrails that are broken or loose. Make sure that handrails are as long as the stairways.  Check any carpeting to make sure that it is firmly attached to the stairs. Fix any carpet that is loose or worn.  Avoid having throw rugs at the top or bottom of the stairs. If you do have throw rugs, attach them to the floor with carpet tape.  Make sure that you have a light switch at the top of the stairs and the bottom of the stairs. If you do not have them, ask someone to add them for you. What else can I do to help prevent falls?  Wear shoes that:  Do not have high heels.  Have rubber bottoms.  Are comfortable and fit you well.  Are closed at the toe. Do not wear sandals.  If you use a stepladder:  Make sure that it is fully opened. Do not climb a closed stepladder.  Make  sure that both sides of the stepladder are locked into place.  Ask someone to hold it for you, if possible.  Clearly mark and make sure that you can see:  Any grab bars or handrails.  First and last steps.  Where the edge of each step is.  Use tools that help you move around (mobility aids) if they are needed. These include:  Canes.  Walkers.  Scooters.  Crutches.  Turn on the lights when you go into a dark area. Replace any light bulbs as soon as they burn out.  Set up your furniture so you have a clear path. Avoid moving your furniture around.  If any of your floors are uneven, fix them.  If there are any pets around you, be aware of where they are.  Review your medicines with your doctor. Some medicines can make you feel dizzy. This can increase your chance of falling. Ask your doctor what other things that you can do to help prevent falls. This information is not intended to replace advice given to you by your health care provider. Make sure you discuss any questions you have with your health care provider. Document Released: 03/10/2009 Document Revised: 10/20/2015 Document Reviewed: 06/18/2014 Elsevier Interactive Patient Education  2017 [employer].

## 2019-01-12 ENCOUNTER — Emergency Department (HOSPITAL_COMMUNITY): Payer: Medicare Other

## 2019-01-12 ENCOUNTER — Encounter (HOSPITAL_COMMUNITY)
Admission: EM | Disposition: A | Discharge status: Skilled Nursing Facility | Payer: Self-pay | Source: Home / Self Care | Attending: Neurology

## 2019-01-12 ENCOUNTER — Inpatient Hospital Stay (HOSPITAL_COMMUNITY)
Admission: EM | Admit: 2019-01-12 | Discharge: 2019-01-26 | DRG: 024 | Disposition: A | Discharge status: Skilled Nursing Facility | Payer: Medicare Other | Attending: Neurology | Admitting: Neurology

## 2019-01-12 ENCOUNTER — Other Ambulatory Visit: Payer: Self-pay

## 2019-01-12 ENCOUNTER — Inpatient Hospital Stay (HOSPITAL_COMMUNITY): Payer: Medicare Other | Admitting: Certified Registered Nurse Anesthetist

## 2019-01-12 ENCOUNTER — Inpatient Hospital Stay (HOSPITAL_COMMUNITY): Payer: Medicare Other

## 2019-01-12 DIAGNOSIS — I6602 Occlusion and stenosis of left middle cerebral artery: Secondary | ICD-10-CM | POA: Diagnosis present

## 2019-01-12 DIAGNOSIS — G8191 Hemiplegia, unspecified affecting right dominant side: Secondary | ICD-10-CM | POA: Diagnosis present

## 2019-01-12 DIAGNOSIS — N136 Pyonephrosis: Secondary | ICD-10-CM | POA: Diagnosis not present

## 2019-01-12 DIAGNOSIS — I340 Nonrheumatic mitral (valve) insufficiency: Secondary | ICD-10-CM | POA: Diagnosis not present

## 2019-01-12 DIAGNOSIS — R1310 Dysphagia, unspecified: Secondary | ICD-10-CM | POA: Diagnosis present

## 2019-01-12 DIAGNOSIS — Z7901 Long term (current) use of anticoagulants: Secondary | ICD-10-CM | POA: Diagnosis not present

## 2019-01-12 DIAGNOSIS — E860 Dehydration: Secondary | ICD-10-CM | POA: Diagnosis present

## 2019-01-12 DIAGNOSIS — I63512 Cerebral infarction due to unspecified occlusion or stenosis of left middle cerebral artery: Principal | ICD-10-CM | POA: Diagnosis present

## 2019-01-12 DIAGNOSIS — Z20828 Contact with and (suspected) exposure to other viral communicable diseases: Secondary | ICD-10-CM | POA: Diagnosis present

## 2019-01-12 DIAGNOSIS — I10 Essential (primary) hypertension: Secondary | ICD-10-CM | POA: Diagnosis present

## 2019-01-12 DIAGNOSIS — N179 Acute kidney failure, unspecified: Secondary | ICD-10-CM | POA: Diagnosis present

## 2019-01-12 DIAGNOSIS — D473 Essential (hemorrhagic) thrombocythemia: Secondary | ICD-10-CM | POA: Diagnosis present

## 2019-01-12 DIAGNOSIS — N3289 Other specified disorders of bladder: Secondary | ICD-10-CM | POA: Diagnosis present

## 2019-01-12 DIAGNOSIS — E870 Hyperosmolality and hypernatremia: Secondary | ICD-10-CM | POA: Diagnosis not present

## 2019-01-12 DIAGNOSIS — R3129 Other microscopic hematuria: Secondary | ICD-10-CM | POA: Diagnosis not present

## 2019-01-12 DIAGNOSIS — N400 Enlarged prostate without lower urinary tract symptoms: Secondary | ICD-10-CM | POA: Diagnosis present

## 2019-01-12 DIAGNOSIS — I639 Cerebral infarction, unspecified: Secondary | ICD-10-CM

## 2019-01-12 DIAGNOSIS — B9620 Unspecified Escherichia coli [E. coli] as the cause of diseases classified elsewhere: Secondary | ICD-10-CM | POA: Diagnosis not present

## 2019-01-12 DIAGNOSIS — Z781 Physical restraint status: Secondary | ICD-10-CM | POA: Diagnosis not present

## 2019-01-12 DIAGNOSIS — R451 Restlessness and agitation: Secondary | ICD-10-CM | POA: Diagnosis not present

## 2019-01-12 DIAGNOSIS — Z87891 Personal history of nicotine dependence: Secondary | ICD-10-CM

## 2019-01-12 DIAGNOSIS — I4821 Permanent atrial fibrillation: Secondary | ICD-10-CM | POA: Diagnosis not present

## 2019-01-12 DIAGNOSIS — R1312 Dysphagia, oropharyngeal phase: Secondary | ICD-10-CM | POA: Diagnosis not present

## 2019-01-12 DIAGNOSIS — R29716 NIHSS score 16: Secondary | ICD-10-CM | POA: Diagnosis present

## 2019-01-12 DIAGNOSIS — E785 Hyperlipidemia, unspecified: Secondary | ICD-10-CM | POA: Diagnosis present

## 2019-01-12 DIAGNOSIS — R310 Gross hematuria: Secondary | ICD-10-CM | POA: Diagnosis not present

## 2019-01-12 DIAGNOSIS — E872 Acidosis, unspecified: Secondary | ICD-10-CM | POA: Diagnosis present

## 2019-01-12 DIAGNOSIS — R471 Dysarthria and anarthria: Secondary | ICD-10-CM | POA: Diagnosis present

## 2019-01-12 DIAGNOSIS — E876 Hypokalemia: Secondary | ICD-10-CM | POA: Diagnosis not present

## 2019-01-12 DIAGNOSIS — I361 Nonrheumatic tricuspid (valve) insufficiency: Secondary | ICD-10-CM

## 2019-01-12 DIAGNOSIS — R4701 Aphasia: Secondary | ICD-10-CM | POA: Diagnosis present

## 2019-01-12 DIAGNOSIS — R414 Neurologic neglect syndrome: Secondary | ICD-10-CM | POA: Diagnosis present

## 2019-01-12 DIAGNOSIS — R4182 Altered mental status, unspecified: Secondary | ICD-10-CM | POA: Diagnosis present

## 2019-01-12 DIAGNOSIS — Z85828 Personal history of other malignant neoplasm of skin: Secondary | ICD-10-CM

## 2019-01-12 DIAGNOSIS — D649 Anemia, unspecified: Secondary | ICD-10-CM | POA: Diagnosis present

## 2019-01-12 DIAGNOSIS — Z7982 Long term (current) use of aspirin: Secondary | ICD-10-CM | POA: Diagnosis not present

## 2019-01-12 DIAGNOSIS — I4891 Unspecified atrial fibrillation: Secondary | ICD-10-CM | POA: Diagnosis present

## 2019-01-12 DIAGNOSIS — R0602 Shortness of breath: Secondary | ICD-10-CM

## 2019-01-12 DIAGNOSIS — I63412 Cerebral infarction due to embolism of left middle cerebral artery: Secondary | ICD-10-CM | POA: Diagnosis not present

## 2019-01-12 DIAGNOSIS — Z79899 Other long term (current) drug therapy: Secondary | ICD-10-CM | POA: Diagnosis not present

## 2019-01-12 DIAGNOSIS — M810 Age-related osteoporosis without current pathological fracture: Secondary | ICD-10-CM | POA: Diagnosis present

## 2019-01-12 DIAGNOSIS — R319 Hematuria, unspecified: Secondary | ICD-10-CM | POA: Diagnosis present

## 2019-01-12 DIAGNOSIS — N390 Urinary tract infection, site not specified: Secondary | ICD-10-CM | POA: Diagnosis not present

## 2019-01-12 HISTORY — PX: IR CT HEAD LTD: IMG2386

## 2019-01-12 HISTORY — PX: IR PERCUTANEOUS ART THROMBECTOMY/INFUSION INTRACRANIAL INC DIAG ANGIO: IMG6087

## 2019-01-12 HISTORY — PX: RADIOLOGY WITH ANESTHESIA: SHX6223

## 2019-01-12 LAB — CBG MONITORING, ED: Glucose-Capillary: 102 mg/dL — ABNORMAL HIGH (ref 70–99)

## 2019-01-12 LAB — COMPREHENSIVE METABOLIC PANEL
ALT: 20 U/L (ref 0–44)
AST: 25 U/L (ref 15–41)
Albumin: 4.2 g/dL (ref 3.5–5.0)
Alkaline Phosphatase: 60 U/L (ref 38–126)
Anion gap: 12 (ref 5–15)
BUN: 16 mg/dL (ref 8–23)
CO2: 21 mmol/L — ABNORMAL LOW (ref 22–32)
Calcium: 9.4 mg/dL (ref 8.9–10.3)
Chloride: 102 mmol/L (ref 98–111)
Creatinine, Ser: 1.37 mg/dL — ABNORMAL HIGH (ref 0.61–1.24)
GFR calc Af Amer: 53 mL/min — ABNORMAL LOW (ref >60)
GFR calc non Af Amer: 46 mL/min — ABNORMAL LOW (ref >60)
Glucose, Bld: 108 mg/dL — ABNORMAL HIGH (ref 70–99)
Potassium: 3.5 mmol/L (ref 3.5–5.1)
Sodium: 135 mmol/L (ref 135–145)
Total Bilirubin: 1.5 mg/dL — ABNORMAL HIGH (ref 0.3–1.2)
Total Protein: 7.5 g/dL (ref 6.5–8.1)

## 2019-01-12 LAB — PROTIME-INR
INR: 1.2 (ref 0.8–1.2)
Prothrombin Time: 14.7 seconds (ref 11.4–15.2)

## 2019-01-12 LAB — DIFFERENTIAL
Abs Immature Granulocytes: 0.04 10*3/uL (ref 0.00–0.07)
Basophils Absolute: 0 10*3/uL (ref 0.0–0.1)
Basophils Relative: 1 %
Eosinophils Absolute: 0.2 10*3/uL (ref 0.0–0.5)
Eosinophils Relative: 2 %
Immature Granulocytes: 1 %
Lymphocytes Relative: 11 %
Lymphs Abs: 0.9 10*3/uL (ref 0.7–4.0)
Monocytes Absolute: 0.4 10*3/uL (ref 0.1–1.0)
Monocytes Relative: 5 %
Neutro Abs: 6.6 10*3/uL (ref 1.7–7.7)
Neutrophils Relative %: 80 %

## 2019-01-12 LAB — I-STAT CHEM 8, ED
BUN: 21 mg/dL (ref 8–23)
Calcium, Ion: 1.19 mmol/L (ref 1.15–1.40)
Chloride: 104 mmol/L (ref 98–111)
Creatinine, Ser: 1.4 mg/dL — ABNORMAL HIGH (ref 0.61–1.24)
Glucose, Bld: 102 mg/dL — ABNORMAL HIGH (ref 70–99)
HCT: 47 % (ref 39.0–52.0)
Hemoglobin: 16 g/dL (ref 13.0–17.0)
Potassium: 3.8 mmol/L (ref 3.5–5.1)
Sodium: 138 mmol/L (ref 135–145)
TCO2: 23 mmol/L (ref 22–32)

## 2019-01-12 LAB — URINALYSIS, ROUTINE W REFLEX MICROSCOPIC
Bilirubin Urine: NEGATIVE
Glucose, UA: NEGATIVE mg/dL
Ketones, ur: NEGATIVE mg/dL
Leukocytes,Ua: NEGATIVE
Nitrite: NEGATIVE
Protein, ur: NEGATIVE mg/dL
Specific Gravity, Urine: 1.009 (ref 1.005–1.030)
pH: 6 (ref 5.0–8.0)

## 2019-01-12 LAB — RAPID URINE DRUG SCREEN, HOSP PERFORMED
Amphetamines: NOT DETECTED
Barbiturates: NOT DETECTED
Benzodiazepines: NOT DETECTED
Cocaine: NOT DETECTED
Opiates: NOT DETECTED
Tetrahydrocannabinol: NOT DETECTED

## 2019-01-12 LAB — CBC
HCT: 44.4 % (ref 39.0–52.0)
Hemoglobin: 15.1 g/dL (ref 13.0–17.0)
MCH: 36 pg — ABNORMAL HIGH (ref 26.0–34.0)
MCHC: 34 g/dL (ref 30.0–36.0)
MCV: 105.7 fL — ABNORMAL HIGH (ref 80.0–100.0)
Platelets: 702 10*3/uL — ABNORMAL HIGH (ref 150–400)
RBC: 4.2 MIL/uL — ABNORMAL LOW (ref 4.22–5.81)
RDW: 12.2 % (ref 11.5–15.5)
WBC: 8.2 10*3/uL (ref 4.0–10.5)
nRBC: 0 % (ref 0.0–0.2)

## 2019-01-12 LAB — ECHOCARDIOGRAM COMPLETE
Height: 70 in
Weight: 2807.78 oz

## 2019-01-12 LAB — MRSA PCR SCREENING: MRSA by PCR: NEGATIVE

## 2019-01-12 LAB — SARS CORONAVIRUS 2 BY RT PCR (HOSPITAL ORDER, PERFORMED IN ~~LOC~~ HOSPITAL LAB): SARS Coronavirus 2: NEGATIVE

## 2019-01-12 LAB — ETHANOL: Alcohol, Ethyl (B): <10 mg/dL (ref <10)

## 2019-01-12 LAB — APTT: aPTT: 29 seconds (ref 24–36)

## 2019-01-12 SURGERY — IR WITH ANESTHESIA
Anesthesia: General

## 2019-01-12 MED ORDER — SODIUM CHLORIDE 0.9 % IV SOLN
INTRAVENOUS | Status: DC | PRN
  Administered 2019-01-12: 30 ug/min via INTRAVENOUS

## 2019-01-12 MED ORDER — FENTANYL CITRATE (PF) 100 MCG/2ML IJ SOLN
50.0000 ug | Freq: Once | INTRAMUSCULAR | Status: AC
  Administered 2019-01-12 (×2): 25 ug via INTRAVENOUS

## 2019-01-12 MED ORDER — ACETAMINOPHEN 650 MG RE SUPP: 650.0000 mg | RECTAL | Status: DC | PRN

## 2019-01-12 MED ORDER — NITROGLYCERIN 1 MG/10 ML FOR IR/CATH LAB
INTRA_ARTERIAL | Status: AC
  Filled 2019-01-12: qty 10

## 2019-01-12 MED ORDER — SODIUM CHLORIDE 0.9 % IV SOLN
INTRAVENOUS | Status: DC
  Administered 2019-01-12 – 2019-01-22 (×7): via INTRAVENOUS

## 2019-01-12 MED ORDER — ACETAMINOPHEN 325 MG PO TABS
650.0000 mg | ORAL_TABLET | ORAL | Status: DC | PRN
  Administered 2019-01-22 – 2019-01-23 (×2): 650 mg via ORAL
  Filled 2019-01-12 (×2): qty 2

## 2019-01-12 MED ORDER — SUCCINYLCHOLINE CHLORIDE 200 MG/10ML IV SOSY
PREFILLED_SYRINGE | INTRAVENOUS | Status: DC | PRN
  Administered 2019-01-12: 80 mg via INTRAVENOUS

## 2019-01-12 MED ORDER — ACETAMINOPHEN 160 MG/5ML PO SOLN: 650.0000 mg | ORAL | Status: DC | PRN

## 2019-01-12 MED ORDER — ASPIRIN 81 MG PO CHEW
CHEWABLE_TABLET | ORAL | Status: AC
  Filled 2019-01-12: qty 1

## 2019-01-12 MED ORDER — FENTANYL CITRATE (PF) 100 MCG/2ML IJ SOLN
INTRAMUSCULAR | Status: AC
  Filled 2019-01-12: qty 2

## 2019-01-12 MED ORDER — TICAGRELOR 90 MG PO TABS
ORAL_TABLET | ORAL | Status: AC
  Filled 2019-01-12: qty 2

## 2019-01-12 MED ORDER — CEFAZOLIN SODIUM-DEXTROSE 2-4 GM/100ML-% IV SOLN
INTRAVENOUS | Status: AC
  Filled 2019-01-12: qty 100

## 2019-01-12 MED ORDER — CLEVIDIPINE BUTYRATE 0.5 MG/ML IV EMUL: 0.0000 mg/h | INTRAVENOUS | Status: DC

## 2019-01-12 MED ORDER — FENTANYL CITRATE (PF) 250 MCG/5ML IJ SOLN
INTRAMUSCULAR | Status: DC | PRN
  Administered 2019-01-12: 25 ug via INTRAVENOUS

## 2019-01-12 MED ORDER — NITROGLYCERIN 1 MG/10 ML FOR IR/CATH LAB
INTRA_ARTERIAL | Status: AC | PRN
  Administered 2019-01-12 (×2): 25 ug via INTRA_ARTERIAL

## 2019-01-12 MED ORDER — CLOPIDOGREL BISULFATE 300 MG PO TABS
ORAL_TABLET | ORAL | Status: AC
  Filled 2019-01-12: qty 1

## 2019-01-12 MED ORDER — TIROFIBAN HCL IN NACL 5-0.9 MG/100ML-% IV SOLN
INTRAVENOUS | Status: AC
  Filled 2019-01-12: qty 100

## 2019-01-12 MED ORDER — FENTANYL CITRATE (PF) 100 MCG/2ML IJ SOLN
INTRAMUSCULAR | Status: AC
  Administered 2019-01-12: 11:00:00 25 ug via INTRAVENOUS
  Filled 2019-01-12: qty 2

## 2019-01-12 MED ORDER — ROCURONIUM BROMIDE 10 MG/ML (PF) SYRINGE
PREFILLED_SYRINGE | INTRAVENOUS | Status: DC | PRN
  Administered 2019-01-12: 50 mg via INTRAVENOUS

## 2019-01-12 MED ORDER — IOHEXOL 300 MG/ML  SOLN
150.0000 mL | Freq: Once | INTRAMUSCULAR | Status: AC | PRN
  Administered 2019-01-12: 54 mL via INTRA_ARTERIAL

## 2019-01-12 MED ORDER — ETOMIDATE 2 MG/ML IV SOLN
INTRAVENOUS | Status: DC | PRN
  Administered 2019-01-12: 9 mg via INTRAVENOUS

## 2019-01-12 MED ORDER — CEFAZOLIN SODIUM-DEXTROSE 2-3 GM-%(50ML) IV SOLR
INTRAVENOUS | Status: DC | PRN
  Administered 2019-01-12: 2 g via INTRAVENOUS

## 2019-01-12 MED ORDER — STROKE: EARLY STAGES OF RECOVERY BOOK
Freq: Once | Status: DC
  Filled 2019-01-12: qty 1

## 2019-01-12 MED ORDER — IOHEXOL 300 MG/ML  SOLN
100.0000 mL | Freq: Once | INTRAMUSCULAR | Status: AC | PRN
  Administered 2019-01-12: 09:00:00 100 mL via INTRAVENOUS

## 2019-01-12 MED ORDER — ACETAMINOPHEN 325 MG PO TABS: 650.0000 mg | ORAL_TABLET | ORAL | Status: DC | PRN

## 2019-01-12 MED ORDER — SODIUM CHLORIDE 0.9 % IV SOLN
INTRAVENOUS | Status: DC
  Administered 2019-01-12: 09:00:00 via INTRAVENOUS

## 2019-01-12 MED ORDER — EPTIFIBATIDE 20 MG/10ML IV SOLN
INTRAVENOUS | Status: AC
  Filled 2019-01-12: qty 10

## 2019-01-12 MED ORDER — LIDOCAINE 2% (20 MG/ML) 5 ML SYRINGE
INTRAMUSCULAR | Status: DC | PRN
  Administered 2019-01-12: 60 mg via INTRAVENOUS

## 2019-01-12 NOTE — Consult Note (Signed)
INR. Post procedore. \CT brain No gross ICH or mass effect. RT groin hemostasis with  An 37F angioseal device.  RT groin soft. Distal pulses palpable DPs and PTs bilaterally. Extubated . Moving all 4s spontaneously. Presently not obeying commands appropriately. S.Sherrika Weakland MD

## 2019-01-12 NOTE — Progress Notes (Signed)
SLP Cancellation Note  Patient Details Name: MANVIR PRABHU MRN: [id number] DOB: [birth year]   Cancelled treatment:       Reason Eval/Treat Not Completed: Medical issues which prohibited therapy. SLP will follow for readiness.  Linsie Lupo L. Tivis Ringer, [place] CCC/SLP Acute Rehabilitation Services Office number [phone] Pager [phone]    Juan Quam Laurice 01/12/2019, 11:50 AM

## 2019-01-12 NOTE — Transfer of Care (Signed)
Immediate Anesthesia Transfer of Care Note  Patient: Nicholas Contreras  Procedure(s) Performed: IR WITH ANESTHESIA (N/A )  Patient Location: ICU  Anesthesia Type:General  Level of Consciousness: pateint uncooperative and responds to stimulation  Airway & Oxygen Therapy: Patient Spontanous Breathing  Post-op Assessment: Report given to RN and Post -op Vital signs reviewed and stable  Post vital signs: Reviewed and stable  Last Vitals:  Vitals Value Taken Time  BP 124/68 01/12/19 1036  Temp    Pulse 135 01/12/19 1044  Resp 24 01/12/19 1045  SpO2 100 % 01/12/19 1044  Vitals shown include unvalidated device data.  Last Pain:  Vitals:   01/12/19 0740  TempSrc: Rectal         Complications: No apparent anesthesia complications

## 2019-01-12 NOTE — Anesthesia Procedure Notes (Signed)
Procedure Name: Intubation Date/Time: 01/12/2019 9:04 AM Performed by: Elayne Snare, CRNA Pre-anesthesia Checklist: Patient identified, Emergency Drugs available, Suction available and Patient being monitored Patient Re-evaluated:Patient Re-evaluated prior to induction Oxygen Delivery Method: Circle System Utilized Preoxygenation: Pre-oxygenation with 100% oxygen Induction Type: IV induction, Rapid sequence and Cricoid Pressure applied Laryngoscope Size: Glidescope and 4 Grade View: Grade I Tube type: Oral Tube size: 7.5 mm Number of attempts: 1 Airway Equipment and Method: Stylet Placement Confirmation: ETT inserted through vocal cords under direct vision,  positive ETCO2 and breath sounds checked- equal and bilateral Secured at: 22 cm Tube secured with: Tape Dental Injury: Teeth and Oropharynx as per pre-operative assessment

## 2019-01-12 NOTE — ED Notes (Signed)
Pablo Ledger, RN attempted foley at this time. Unable to advance foley due to resistance. Condom catheter placed at this time by Pablo Ledger., RN

## 2019-01-12 NOTE — Code Documentation (Signed)
85yo male arriving to [facility] via [place] at [phone]. Patient from home where he was LKW on 01/11/2019 at 2100. Patient noted to be unable to get out of bed this morning by his spouse who has dementia and their son was called. EMS was called and transferred the patient to the ED. 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 Patient noted to have aphasia and right sided weakness and code stroke activated in the ED. Stroke team to CT. CT completed followed by CTA and CTP. CTA showing left M1 occlusion. CTP with 92cc penumbra and no core infarct. NIHSS 16, see documentation for details and code stroke times. Patient with expressive aphasia, right sided weakness (arm>leg) and right hemianopsia. IR team notified at [phone] and again at [place]. CRNA to the bedside at [place]. Patient intubated in the ED by anesthesia team and transferred to IR with team at 0910. Bedside handoff with IR team. Patient to be admitted to ICU post-procedure.

## 2019-01-12 NOTE — Progress Notes (Signed)
  Echocardiogram 2D Echocardiogram has been performed.  Nicholas Contreras 01/12/2019, 3:01 PM

## 2019-01-12 NOTE — Progress Notes (Signed)
Patient ID: Nicholas Contreras, male   DOB: 10-Jan-1929, 85 y.o.   MRN: [id number] INR. 88TR RT H M MRSS  1-2 LSW 900pm LN New onset of aphasia and RT sided weakness CT brain No ICH ASPECTS 10 LT MCA hyperdense sign. CTA occluded :LLt MCA M1 CTP No core with penumbra of   36ml . Endovascular treatment t D/W son.Reasons,procedure and alternatives discussed. Risks of ICH of 10 % ,worsening neuro function,death ,inability to revascularize and vascular injury reviewed. Son expressed understanding and provided consent to proceed with treatment. S.Orren Pietsch MD

## 2019-01-12 NOTE — H&P (Signed)
Chief Complaint: " Unable to get up from bed"  History obtained from: Patient and Chart     HPI:                                                                                                                                       Nicholas Contreras is a 85 y.o. male with past medical history significant for essential thrombocythemia, hyperlipidemia, hypertension, atrial fibrillation not on anticoagulation presents to ED brought by EMS for garbled speech and right-sided weakness.  Last known normal was 9 PM last night when he went to bed. His wife found the patient with a garbled speech and unable to get out of bed.  Patient's wife alerted neighbors and EMS was informed.  On arrival, patient was aphasic.  He had right sided plegia and hemineglect.  Stat CT head was performed which showed no acute hemorrhage.  CT angiogram demonstrated a left M1 occlusion and CT perfusion showed a mismatch of 92 ml.    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  He has atrial fibrillation but not on anticoagulation.  Date last known well: 8.16.20 Time last known well: 21.00 tPA Given: no, outside window NIHSS: 16 Baseline MRS 0-1   Past Medical History:  Diagnosis Date  . BPH (benign prostatic hyperplasia)   . Essential thrombocythemia ([place])   . Hyperlipidemia   . Hypertension   . Osteoporosis     Past Surgical History:  Procedure Laterality Date  . SKIN CANCER EXCISION     was on head     No family history on file. Social History:  reports that he has never smoked. He has quit using smokeless tobacco. He reports previous alcohol use. He reports that he does not use drugs.  Allergies:  Allergies  Allergen Reactions  . Codeine     Per son, severity unknown    Medications:                                                                                                                        I reviewed home medications   ROS:  Unable to review systems as patient is aphasic   Examination:                                                                                                      General: Appears well-developed  Psych: Affect appropriate to situation Eyes: No scleral injection HENT: No OP obstrucion Head: Normocephalic.  Cardiovascular: Heart rate irregularly irregular Respiratory: Effort normal and breath sounds normal to anterior ascultation GI: Soft.  No distension. There is no tenderness.  Skin: WDI    Neurological Examination Mental Status: Alert, can answer his name.  In correctly states his age.  Follows commands intermittently.  Unable to name objects and speak in sentences. Cranial Nerves: II: Visual fields : Reduced blink to threat on the right side III,IV, VI: ptosis not present, able to cross midline, pupils equal, round, reactive to light and accommodation V,VII: Mild right facial droop XII: midline tongue extension Motor: Right : Upper extremity   0/5    Left:     Upper extremity   5/5  Lower extremity   0/5     Lower extremity   5/5 Tone and bulk:normal tone throughout; no atrophy noted Sensory: Unable to assess HPI Dr. Rico Junker cannot Deep Tendon Reflexes: 2+ and symmetric throughout Plantars: Right: downgoing   Left: downgoing Cerebellar: unable to assess  Gait: unable to walk     Lab Results: Basic Metabolic Panel: Recent Labs  Lab 01/12/19 0747 01/12/19 0801  NA 135 138  K 3.5 3.8  CL 102 104  CO2 21*  --   GLUCOSE 108* 102*  BUN 16 21  CREATININE 1.37* 1.40*  CALCIUM 9.4  --     CBC: Recent Labs  Lab 01/12/19 0747 01/12/19 0801  WBC 8.2  --   NEUTROABS 6.6  --   HGB 15.1 16.0  HCT 44.4 47.0  MCV 105.7*  --   PLT 702*  --     Coagulation Studies: Recent Labs    01/12/19 0747  LABPROT 14.7  INR 1.2    Imaging: Ct Code Stroke Cta Head W/wo  Contrast  Result Date: 01/12/2019 CLINICAL DATA:  Code stroke.  Right weakness with speech difficulty EXAM: CT ANGIOGRAPHY HEAD AND NECK CT PERFUSION BRAIN TECHNIQUE: Multidetector CT imaging of the head and neck was performed using the standard protocol during bolus administration of intravenous contrast. Multiplanar CT image reconstructions and MIPs were obtained to evaluate the vascular anatomy. Carotid stenosis measurements (when applicable) are obtained utilizing NASCET criteria, using the distal internal carotid diameter as the denominator. Multiphase CT imaging of the brain was performed following IV bolus contrast injection. Subsequent parametric perfusion maps were calculated using RAPID software. CONTRAST:  138mL OMNIPAQUE IOHEXOL 300 MG/ML  SOLN COMPARISON:  Noncontrast head CT earlier today FINDINGS: CTA NECK FINDINGS Aortic arch: Mild atherosclerotic plaque.  Two vessel branching Right carotid system: Vessels are smooth and widely patent. Limited atheromatous changes. Left carotid system: Vessels are smooth and widely patent. Limited atheromatous changes. Vertebral arteries: No proximal subclavian stenosis. Slight left vertebral artery dominance. No branch occlusion or beading.  Skeleton: Degenerative changes without acute finding. Other neck: Right thyroid nodules without invasive features or adenopathy, likely incidental given patient age. Upper chest: No acute finding Review of the MIP images confirms the above findings CTA HEAD FINDINGS Anterior circulation: Calcified plaque along the carotid siphons. Left MCA occlusion with abrupt cut off. There is downstream reconstitution with underfilling. Negative for aneurysm Posterior circulation: Vertebral arteries are smooth and widely patent. Patent bilateral PICA. Tiny distal basilar in the setting of bilateral fetal type PCA. Venous sinuses: Patent Anatomic variants: As above Review of the MIP images confirms the above findings CT Brain Perfusion  Findings: ASPECTS: 10 CBF (<30%) Volume: 6mL Perfusion (Tmax>6.0s) volume: 31mL Mismatch Volume: 32mL These results were communicated to Dr. Lorraine Lax at 8:40 amon 8/17/2020by text page via the [facility] messaging system. A catheter angiogram/embolectomy has already been ordered. IMPRESSION: 1. Left M1 embolus/occlusion with downstream reconstitution. There is 92 cc penumbra with no core infarct by CT perfusion. 2. Limited atheromatous changes. Electronically Signed   By: Monte Fantasia M.D.   On: 01/12/2019 08:46   Ct Code Stroke Cta Neck W/wo Contrast  Result Date: 01/12/2019 CLINICAL DATA:  Code stroke.  Right weakness with speech difficulty EXAM: CT ANGIOGRAPHY HEAD AND NECK CT PERFUSION BRAIN TECHNIQUE: Multidetector CT imaging of the head and neck was performed using the standard protocol during bolus administration of intravenous contrast. Multiplanar CT image reconstructions and MIPs were obtained to evaluate the vascular anatomy. Carotid stenosis measurements (when applicable) are obtained utilizing NASCET criteria, using the distal internal carotid diameter as the denominator. Multiphase CT imaging of the brain was performed following IV bolus contrast injection. Subsequent parametric perfusion maps were calculated using RAPID software. CONTRAST:  18mL OMNIPAQUE IOHEXOL 300 MG/ML  SOLN COMPARISON:  Noncontrast head CT earlier today FINDINGS: CTA NECK FINDINGS Aortic arch: Mild atherosclerotic plaque.  Two vessel branching Right carotid system: Vessels are smooth and widely patent. Limited atheromatous changes. Left carotid system: Vessels are smooth and widely patent. Limited atheromatous changes. Vertebral arteries: No proximal subclavian stenosis. Slight left vertebral artery dominance. No branch occlusion or beading. Skeleton: Degenerative changes without acute finding. Other neck: Right thyroid nodules without invasive features or adenopathy, likely incidental given patient age. Upper chest: No acute  finding Review of the MIP images confirms the above findings CTA HEAD FINDINGS Anterior circulation: Calcified plaque along the carotid siphons. Left MCA occlusion with abrupt cut off. There is downstream reconstitution with underfilling. Negative for aneurysm Posterior circulation: Vertebral arteries are smooth and widely patent. Patent bilateral PICA. Tiny distal basilar in the setting of bilateral fetal type PCA. Venous sinuses: Patent Anatomic variants: As above Review of the MIP images confirms the above findings CT Brain Perfusion Findings: ASPECTS: 10 CBF (<30%) Volume: 18mL Perfusion (Tmax>6.0s) volume: 42mL Mismatch Volume: 78mL These results were communicated to Dr. Lorraine Lax at 8:40 amon 8/17/2020by text page via the [facility] messaging system. A catheter angiogram/embolectomy has already been ordered. IMPRESSION: 1. Left M1 embolus/occlusion with downstream reconstitution. There is 92 cc penumbra with no core infarct by CT perfusion. 2. Limited atheromatous changes. Electronically Signed   By: Monte Fantasia M.D.   On: 01/12/2019 08:46   Ct Code Stroke Cta Cerebral Perfusion W/wo Contrast  Result Date: 01/12/2019 CLINICAL DATA:  Code stroke.  Right weakness with speech difficulty EXAM: CT ANGIOGRAPHY HEAD AND NECK CT PERFUSION BRAIN TECHNIQUE: Multidetector CT imaging of the head and neck was performed using the standard protocol during bolus administration of intravenous contrast. Multiplanar CT image reconstructions  and MIPs were obtained to evaluate the vascular anatomy. Carotid stenosis measurements (when applicable) are obtained utilizing NASCET criteria, using the distal internal carotid diameter as the denominator. Multiphase CT imaging of the brain was performed following IV bolus contrast injection. Subsequent parametric perfusion maps were calculated using RAPID software. CONTRAST:  176mL OMNIPAQUE IOHEXOL 300 MG/ML  SOLN COMPARISON:  Noncontrast head CT earlier today FINDINGS: CTA NECK FINDINGS  Aortic arch: Mild atherosclerotic plaque.  Two vessel branching Right carotid system: Vessels are smooth and widely patent. Limited atheromatous changes. Left carotid system: Vessels are smooth and widely patent. Limited atheromatous changes. Vertebral arteries: No proximal subclavian stenosis. Slight left vertebral artery dominance. No branch occlusion or beading. Skeleton: Degenerative changes without acute finding. Other neck: Right thyroid nodules without invasive features or adenopathy, likely incidental given patient age. Upper chest: No acute finding Review of the MIP images confirms the above findings CTA HEAD FINDINGS Anterior circulation: Calcified plaque along the carotid siphons. Left MCA occlusion with abrupt cut off. There is downstream reconstitution with underfilling. Negative for aneurysm Posterior circulation: Vertebral arteries are smooth and widely patent. Patent bilateral PICA. Tiny distal basilar in the setting of bilateral fetal type PCA. Venous sinuses: Patent Anatomic variants: As above Review of the MIP images confirms the above findings CT Brain Perfusion Findings: ASPECTS: 10 CBF (<30%) Volume: 64mL Perfusion (Tmax>6.0s) volume: 31mL Mismatch Volume: 93mL These results were communicated to Dr. Lorraine Lax at 8:40 amon 8/17/2020by text page via the [facility] messaging system. A catheter angiogram/embolectomy has already been ordered. IMPRESSION: 1. Left M1 embolus/occlusion with downstream reconstitution. There is 92 cc penumbra with no core infarct by CT perfusion. 2. Limited atheromatous changes. Electronically Signed   By: Monte Fantasia M.D.   On: 01/12/2019 08:46   Ct Head Code Stroke Wo Contrast  Result Date: 01/12/2019 CLINICAL DATA:  Code stroke. Right-sided weakness and slurred speech EXAM: CT HEAD WITHOUT CONTRAST TECHNIQUE: Contiguous axial images were obtained from the base of the skull through the vertex without intravenous contrast. COMPARISON:  None. FINDINGS: Brain: No evidence  of acute infarction, hemorrhage, hydrocephalus, extra-axial collection or mass lesion/mass effect. Advanced chronic small vessel ischemic change in the cerebral white matter. Central predominant atrophy. Vascular: Atherosclerotic calcification. Equivocal for hyperdense left M1 segment. Skull: No acute or aggressive finding Sinuses/Orbits: Bilateral cataract resection Other: These results were communicated to Dr. Lorraine Lax at [facility] 8/17/2020by text page via the [facility] messaging system. ASPECTS [facility] Stroke Program Early CT Score) - Ganglionic level infarction (caudate, lentiform nuclei, internal capsule, insula, M1-M3 cortex): 7 - Supraganglionic infarction (M4-M6 cortex): 3 Total score (0-10 with 10 being normal): 10 IMPRESSION: 1. Equivocal for hyperdense left MCA. No hemorrhage or visible acute infarct.ASPECTS is 10. 2. Prominent atrophy and chronic small vessel ischemia. Electronically Signed   By: Monte Fantasia M.D.   On: 01/12/2019 08:17     ASSESSMENT AND PLAN  85 year old male with past medical history of atrial fibrillation not on anticoagulation, essential thrombocytosis, hypertension, hyperlipidemia presents to the ED with aphasia and right-sided weakness due to left MCA stroke.  CT angiogram with left M1 occlusion and CT perfusion showed 92 ml penumbra with no core.  After discussion with son regarding risks versus benefit patient was taken to mechanical thrombectomy.   Left MCA acute ischemic stroke secondary to left M1 occlusion status post emergent mechanical thrombectomy with TICI 3 recanalization.  Recommendations # MRI of the brain without contrast #Transthoracic Echo  #Hold antiplatelets until MRI brain/repeat CT to rule out  hemorrhage post thrombectomy.  Will eventually need anticoagulation #Start or continue Atorvastatin 40 mg/other high intensity statin after patient passes swallow evaluation # BP goal: 622 -633 systolic post IR goal  # HBAIC and Lipid profile # Telemetry  monitoring # Frequent neuro checks # stroke swallow screen   Atrial fibrillation -Currently rate controlled -PRN metoprolol 5 mg IV if heart rate greater than 130 -Timeline to start anticoagulation after determination size of infarct on MRI brain  HTN -BP goals as stated above due to patient being status post IR  HLD -Atorvastatin 40 mg daily -Lipid profile  Essential thrombocytosis -Hold hydroxyurea for now and restart tomorrow   This patient is neurologically critically ill due to left MCA stroke status post IR. He is at risk for significant risk of neurological worsening from cerebral edema,  death from brain herniation, heart failure, hemorrhagic conversion, infection, respiratory failure and seizure. This patient's care requires constant monitoring of vital signs, hemodynamics, respiratory and cardiac monitoring, review of multiple databases, neurological assessment, discussion with family, other specialists and medical decision making of high complexity.  I spent 55 minutes of neurocritical time in the care of this patient.     Please page stroke NP  Or  PA  Or MD from 8am -4 pm  as this patient from this time will be  followed by the stroke.   You can look them up on [email or web address]  Password [facility]    Dorance Spink [facility] Pager Number [phone]

## 2019-01-12 NOTE — ED Provider Notes (Signed)
[facility] EMERGENCY DEPARTMENT Provider Note   CSN: [id number] Arrival date & time: 01/12/19  4132    History   Chief Complaint Chief Complaint  Patient presents with  . Code Stroke    Stroke like sx    HPI Nicholas Contreras is a 85 y.o. male.     HPI Patient presents with mental status change and inability to get out of bed this morning.  At baseline he is ambulatory without assistance.  He helps in caring for his wife at home who has dementia.  She however is also active.  She does word puzzles and they live independently.  Patient son reports that his mother is reliable for observing changes in her husband's behaviors or activities and reliable for the time that he went to bed at least within a few hours.  This morning she went to get him out of bed at 620 and he could not get out of bed.  She called her son at that time and he instructed her to call 911.  Patient son reports that his mother and father have somewhat different routines in terms of when they eat dinner and their activities in their home however, he reports that his father is very routine oriented and his mother would have noticed if he had done something different or unusual yesterday evening.  She reports that he went to bed sometime after 9 PM.  He had been up watching television.  Patient son reports he last actually spoke to his dad day before yesterday on the phone and he was completely normal then.  Son reports that the patient is probably "legally blind" but really will not go to see the doctor about it.  He reports he is only got about 20/40 vision in 1 eye.  He cannot drive due to poor vision.  He has not been sick recently. Past Medical History:  Diagnosis Date  . BPH (benign prostatic hyperplasia)   . Essential thrombocythemia ([place])   . Hyperlipidemia   . Hypertension   . Osteoporosis     Patient Active Problem List   Diagnosis Date Noted  . Thrombocytosis ([place]) 10/12/2014    Past  Surgical History:  Procedure Laterality Date  . SKIN CANCER EXCISION     was on head         Home Medications    Prior to Admission medications   Medication Sig Start Date End Date Taking? Authorizing Provider  aspirin 81 MG tablet Take 81 mg by mouth daily.    [provider]  atenolol (TENORMIN) 50 MG tablet Take 50 mg by mouth daily.    [provider]  enalapril (VASOTEC) 20 MG tablet TAKE 1 TABLET TWICE A DAY 03/05/18   Glendale Chard, MD  ergocalciferol (VITAMIN D2) 50000 UNITS capsule Take 50,000 Units by mouth once a week.    [provider]  felodipine (PLENDIL) 10 MG 24 hr tablet TAKE 1 TABLET DAILY 06/16/18   Glendale Chard, MD  hydrochlorothiazide (HYDRODIURIL) 12.5 MG tablet Take 12.5 mg by mouth daily.    [provider]  hydroxyurea (HYDREA) 500 MG capsule TAKE 1 CAPSULE DAILY WITH FOOD TO MINIMIZE GASTROINTESTINAL SIDE EFFECTS 06/04/18   Wyatt Portela, MD  pravastatin (PRAVACHOL) 80 MG tablet TAKE 1 TABLET DAILY 11/04/18   Glendale Chard, MD  Tamsulosin HCl (FLOMAX) 0.4 MG CAPS Take 0.4 mg by mouth daily.    [provider]    Family History No family history  on file.  Social History Social History   Tobacco Use  . Smoking status: Never Smoker  . Smokeless tobacco: Former Network engineer Use Topics  . Alcohol use: Not Currently  . Drug use: Never     Allergies   Patient has no known allergies.   Review of Systems Review of Systems Level 5 caveat cannot obtain review of systems due to mental status change.  Physical Exam Updated Vital Signs SpO2 99%   Physical Exam Constitutional:      Comments: Patient is awake with eyes open.  No respiratory distress.  He does seem to have a left gaze preference.  He has difficulty speaking and speech is garbled in response to questions.  HENT:     Head: Normocephalic and atraumatic.     Mouth/Throat:     Mouth: Mucous membranes are moist.     Pharynx: Oropharynx is  clear.  Eyes:     Comments: Pupils are about 2 mm symmetric.  Patient has left gaze preference.  He can somewhat move gaze past midline to the right.  Neck:     Musculoskeletal: Neck supple.  Cardiovascular:     Rate and Rhythm: Normal rate and regular rhythm.  Pulmonary:     Effort: Pulmonary effort is normal.     Breath sounds: Normal breath sounds.     Comments: Breath sounds are grossly clear.  Patient does not make good effort for large inspiratory airflow. Abdominal:     General: There is no distension.     Palpations: Abdomen is soft.     Tenderness: There is no abdominal tenderness. There is no guarding.  Genitourinary:    Comments: Patient had large loose brown stool in the bed that nurses are cleaning.  Rectal exam is for clear rectum without impaction and soft runny brown stool.  Genitals normal in appearance without any swelling. Musculoskeletal:     Comments: No significant peripheral edema.  Patient is slightly deconditioned with some muscular atrophy but symmetric in appearance.  Skin:    General: Skin is warm and dry.  Neurological:     Comments: Patient has left gaze deviation.  Patient attempts to respond to questions.  Speech is however garbled and difficult to understand.  He does follow commands.  He seems to have expressive a aphasia.  Right upper extremity is flaccid.  Left extremity patient can perform grip and will spontaneously elevate the left upper extremity off of the bed at command.  Patient can elevate the right extremity slightly and get it off the bed with much effort.  Patient can easily elevate the left extremity at command and hold.      ED Treatments / Results  Labs (all labs ordered are listed, but only abnormal results are displayed) Labs Reviewed  CBG MONITORING, ED - Abnormal; Notable for the following components:      Result Value   Glucose-Capillary 102 (*)    All other components within normal limits  ETHANOL  PROTIME-INR  APTT  CBC   DIFFERENTIAL  COMPREHENSIVE METABOLIC PANEL  RAPID URINE DRUG SCREEN, HOSP PERFORMED  URINALYSIS, ROUTINE W REFLEX MICROSCOPIC  I-STAT CHEM 8, ED    EKG None  Radiology No results found.  Procedures Procedures (including critical care time) CRITICAL CARE Performed by: Charlesetta Shanks   Total critical care time: 30 minutes  Critical care time was exclusive of separately billable procedures and treating other patients.  Critical care was necessary to treat or prevent imminent or life-threatening  deterioration.  Critical care was time spent personally by me on the following activities: development of treatment plan with patient and/or surrogate as well as nursing, discussions with consultants, evaluation of patient's response to treatment, examination of patient, obtaining history from patient or surrogate, ordering and performing treatments and interventions, ordering and review of laboratory studies, ordering and review of radiographic studies, pulse oximetry and re-evaluation of patient's condition. Medications Ordered in ED Medications - No data to display   Initial Impression / Assessment and Plan / ED Course  I have reviewed the triage vital signs and the nursing notes.  Pertinent labs & imaging results that were available during my care of the patient were reviewed by me and considered in my medical decision making (see chart for details).       Patient presents with a dense right paresis and left gaze deviation.  He was active at baseline yesterday evening last seen well is 9 PM.  Will activate code stroke for LVO suspected.  Final Clinical Impressions(s) / ED Diagnoses   Final diagnoses:  Acute ischemic stroke [facility])    ED Discharge Orders    None       Charlesetta Shanks, MD 01/12/19 [phone]

## 2019-01-12 NOTE — ED Notes (Addendum)
100 mcg fentanyl given to CRNA and RSI kit.

## 2019-01-12 NOTE — Anesthesia Preprocedure Evaluation (Addendum)
Anesthesia Evaluation  Patient identified by MRN, date of birth, ID band Patient awake    Reviewed: Allergy & Precautions, H&P , NPO status , Patient's Chart, lab work & pertinent test results, Unable to perform ROS - Chart review only  Airway Mallampati: II  TM Distance: >3 FB Neck ROM: Full    Dental no notable dental hx. (+) Edentulous Upper, Edentulous Lower   Pulmonary neg pulmonary ROS,    Pulmonary exam normal breath sounds clear to auscultation       Cardiovascular hypertension, + CAD  Normal cardiovascular exam Rhythm:Regular Rate:Normal     Neuro/Psych negative neurological ROS  negative psych ROS   GI/Hepatic negative GI ROS, Neg liver ROS,   Endo/Other  negative endocrine ROS  Renal/GU negative Renal ROS     Musculoskeletal   Abdominal   Peds  Hematology  (+) Blood dyscrasia, , myleoproliferative disorder On Hydroxy urea and low dose ASA   Anesthesia Other Findings   Reproductive/Obstetrics                             Anesthesia Physical Anesthesia Plan  ASA: IV and emergent  Anesthesia Plan: General   Post-op Pain Management:    Induction: Intravenous  PONV Risk Score and Plan: 4 or greater and Treatment may vary due to age or medical condition, Dexamethasone and Ondansetron  Airway Management Planned: Oral ETT  Additional Equipment:   Intra-op Plan:   Post-operative Plan: Possible Post-op intubation/ventilation  Informed Consent: I have reviewed the patients History and Physical, chart, labs and discussed the procedure including the risks, benefits and alternatives for the proposed anesthesia with the patient or authorized representative who has indicated his/her understanding and acceptance.     Dental advisory given  Plan Discussed with: CRNA  Anesthesia Plan Comments:        Anesthesia Quick Evaluation

## 2019-01-12 NOTE — Anesthesia Postprocedure Evaluation (Signed)
Anesthesia Post Note  Patient: Nicholas Contreras  Procedure(s) Performed: IR WITH ANESTHESIA (N/A )     Patient location during evaluation: SICU Anesthesia Type: General Level of consciousness: sedated Pain management: pain level controlled Vital Signs Assessment: post-procedure vital signs reviewed and stable Respiratory status: patient connected to face mask oxygen and spontaneous breathing Cardiovascular status: stable Postop Assessment: no apparent nausea or vomiting Anesthetic complications: no    Last Vitals:  Vitals:   01/12/19 0830 01/12/19 1045  BP: (!) 118/100 (!) 136/91  Pulse: 75 (!) 135  Resp: 13 (!) 24  Temp:  (!) 35.7 C  SpO2: 98% 100%    Last Pain:  Vitals:   01/12/19 0740  TempSrc: Rectal                 Barnet Glasgow

## 2019-01-12 NOTE — ED Triage Notes (Addendum)
Patient arrived via [facility] with due to having stroke like symptoms. Patients wife alerted neighbor for help this morning due to patient unable to move/get out of bed. Patient also has garbled speech and left sided weakness per ems. Per ems, patient is usually able to ambulate. Last seen normal was at 2100 last night.  Hx of a. fib  Wife also has dementia, so unable to get full hx per ems.

## 2019-01-12 NOTE — Sedation Documentation (Signed)
Patient arrived in 82N19. Report given to PACU RN and 4N RN at bedside. Groin site check and pulses checked with RNs.

## 2019-01-12 NOTE — Procedures (Signed)
S/P lt common carotid arteriogram followed bt complete revascularization of occluded Lt MCA at origin with x 1 pass with 53mm x 40 mm solitaire x  Retriever  device achieving a TICI 3 revascularization. Arlean Hopping MD

## 2019-01-13 ENCOUNTER — Encounter (HOSPITAL_COMMUNITY): Payer: Self-pay | Admitting: Radiology

## 2019-01-13 ENCOUNTER — Inpatient Hospital Stay (HOSPITAL_COMMUNITY): Payer: Medicare Other

## 2019-01-13 DIAGNOSIS — I6602 Occlusion and stenosis of left middle cerebral artery: Secondary | ICD-10-CM

## 2019-01-13 LAB — CBC WITH DIFFERENTIAL/PLATELET
Abs Immature Granulocytes: 0.03 10*3/uL (ref 0.00–0.07)
Basophils Absolute: 0 10*3/uL (ref 0.0–0.1)
Basophils Relative: 0 %
Eosinophils Absolute: 0.1 10*3/uL (ref 0.0–0.5)
Eosinophils Relative: 1 %
HCT: 36.9 % — ABNORMAL LOW (ref 39.0–52.0)
Hemoglobin: 12.3 g/dL — ABNORMAL LOW (ref 13.0–17.0)
Immature Granulocytes: 0 %
Lymphocytes Relative: 13 %
Lymphs Abs: 1 10*3/uL (ref 0.7–4.0)
MCH: 35.3 pg — ABNORMAL HIGH (ref 26.0–34.0)
MCHC: 33.3 g/dL (ref 30.0–36.0)
MCV: 106 fL — ABNORMAL HIGH (ref 80.0–100.0)
Monocytes Absolute: 0.7 10*3/uL (ref 0.1–1.0)
Monocytes Relative: 9 %
Neutro Abs: 5.9 10*3/uL (ref 1.7–7.7)
Neutrophils Relative %: 77 %
Platelets: 604 10*3/uL — ABNORMAL HIGH (ref 150–400)
RBC: 3.48 MIL/uL — ABNORMAL LOW (ref 4.22–5.81)
RDW: 12.6 % (ref 11.5–15.5)
WBC: 7.7 10*3/uL (ref 4.0–10.5)
nRBC: 0 % (ref 0.0–0.2)

## 2019-01-13 LAB — LIPID PANEL
Cholesterol: 145 mg/dL (ref 0–200)
HDL: 39 mg/dL — ABNORMAL LOW (ref >40)
LDL Cholesterol: 87 mg/dL (ref 0–99)
Total CHOL/HDL Ratio: 3.7 RATIO
Triglycerides: 97 mg/dL (ref <150)
VLDL: 19 mg/dL (ref 0–40)

## 2019-01-13 LAB — BASIC METABOLIC PANEL
Anion gap: 8 (ref 5–15)
BUN: 11 mg/dL (ref 8–23)
CO2: 21 mmol/L — ABNORMAL LOW (ref 22–32)
Calcium: 8.5 mg/dL — ABNORMAL LOW (ref 8.9–10.3)
Chloride: 107 mmol/L (ref 98–111)
Creatinine, Ser: 1.22 mg/dL (ref 0.61–1.24)
GFR calc Af Amer: >60 mL/min (ref >60)
GFR calc non Af Amer: 53 mL/min — ABNORMAL LOW (ref >60)
Glucose, Bld: 87 mg/dL (ref 70–99)
Potassium: 3.8 mmol/L (ref 3.5–5.1)
Sodium: 136 mmol/L (ref 135–145)

## 2019-01-13 LAB — HEMOGLOBIN A1C
Hgb A1c MFr Bld: 5.1 % (ref 4.8–5.6)
Mean Plasma Glucose: 99.67 mg/dL

## 2019-01-13 MED ORDER — HALOPERIDOL LACTATE 5 MG/ML IJ SOLN
1.0000 mg | Freq: Once | INTRAMUSCULAR | Status: AC
  Administered 2019-01-13: 1 mg via INTRAVENOUS

## 2019-01-13 MED ORDER — HALOPERIDOL LACTATE 5 MG/ML IJ SOLN
INTRAMUSCULAR | Status: AC
  Filled 2019-01-13: qty 1

## 2019-01-13 MED ORDER — ASPIRIN 300 MG RE SUPP
300.0000 mg | Freq: Every day | RECTAL | Status: DC
  Administered 2019-01-13 – 2019-01-14 (×2): 300 mg via RECTAL
  Filled 2019-01-13: qty 1

## 2019-01-13 MED ORDER — SODIUM CHLORIDE 0.9 % IV BOLUS
250.0000 mL | Freq: Once | INTRAVENOUS | Status: AC
  Administered 2019-01-13: 250 mL via INTRAVENOUS

## 2019-01-13 NOTE — Progress Notes (Signed)
Inpatient Rehab Admissions:  Inpatient Rehab Consult received.  AC attempted to meet pt at the bedside for IP Rehab assessment. At that time pt was becoming aggressive with nursing staff, requiring intervention. AC will attempt to follow up tomorrow for bedside assessment to determine appropriateness for possible CIR admission.   Jhonnie Garner, OTR/L  Rehab Admissions Coordinator  [phone] 01/13/2019 5:20 PM

## 2019-01-13 NOTE — Evaluation (Signed)
Clinical/Bedside Swallow Evaluation Patient Details  Name: Nicholas Contreras MRN: [id number] Date of Birth: Oct 08, 1928  Today's Date: 01/13/2019 Time: SLP Start Time (ACUTE ONLY): 17 SLP Stop Time (ACUTE ONLY): 1252 SLP Time Calculation (min) (ACUTE ONLY): 26 min  Past Medical History:  Past Medical History:  Diagnosis Date  . BPH (benign prostatic hyperplasia)   . Essential thrombocythemia ([place])   . Hyperlipidemia   . Hypertension   . Osteoporosis    Past Surgical History:  Past Surgical History:  Procedure Laterality Date  . RADIOLOGY WITH ANESTHESIA N/A 01/12/2019   Procedure: IR WITH ANESTHESIA;  Surgeon: Radiologist, Medication, MD;  Location: [place];  Service: Radiology;  Laterality: N/A;  . SKIN CANCER EXCISION     was on head    HPI:  Nicholas Contreras is a 85 y.o. male admitted with speech disturbance, aphasia and decreased mobility. MRI Patchy small volume acute ischemic nonhemorrhagic infarcts involving the left basal ganglia and subcortical left frontal lobe as above, underlying age-related cerebral atrophy with moderate chronic microvascular ischemic disease.   Assessment / Plan / Recommendation Clinical Impression  Pt demonstrated a suspected pharyngoesophageal dysphagia across trialed consistencies. He did not exhibit significant weakness or decreased coordination during oral-motor exam. Multiple and effortful swallows (3-4) observed indicative of possible pharyngeal dysphagia and/or UES dysfunction. Eructation present as well although he denies reflux or prior esophageal impairments. His vocal quality was intermittently wet. Pt reported his swallowing is "different and harder now" with onset several days prior to admission. Recommend pt have instrumental assessment with MBS however may have meds crushed in puree presently. Continue oral care.       SLP Visit Diagnosis: Dysphagia, unspecified (R13.10)    Aspiration Risk  Mild aspiration risk;Moderate aspiration risk     Diet Recommendation NPO except meds;Other (Comment)(crushed in puree)   Medication Administration: Crushed with puree    Other  Recommendations Oral Care Recommendations: Oral care QID   Follow up Recommendations Inpatient Rehab      Frequency and Duration            Prognosis Prognosis for Safe Diet Advancement: Good      Swallow Study   General HPI: Nicholas Contreras is a 85 y.o. male admitted with speech disturbance, aphasia and decreased mobility. MRI Patchy small volume acute ischemic nonhemorrhagic infarcts involving the left basal ganglia and subcortical left frontal lobe as above, underlying age-related cerebral atrophy with moderate chronic microvascular ischemic disease. Type of Study: Bedside Swallow Evaluation Previous Swallow Assessment: (none found) Diet Prior to this Study: NPO Temperature Spikes Noted: No Respiratory Status: Room air History of Recent Intubation: No Behavior/Cognition: Lethargic/Drowsy;Cooperative;Pleasant mood Oral Cavity Assessment: Dry Oral Care Completed by SLP: Yes Oral Cavity - Dentition: Missing dentition;Poor condition Vision: Functional for self-feeding Self-Feeding Abilities: Needs assist Patient Positioning: Upright in bed Baseline Vocal Quality: Normal Volitional Cough: Weak Volitional Swallow: Unable to elicit    Oral/Motor/Sensory Function Overall Oral Motor/Sensory Function: Within functional limits   Ice Chips Ice chips: Not tested   Thin Liquid Thin Liquid: Impaired Presentation: Cup Oral Phase Impairments: (none) Oral Phase Functional Implications: (none) Pharyngeal  Phase Impairments: Multiple swallows;Wet Vocal Quality    Nectar Thick Nectar Thick Liquid: Not tested   Honey Thick Honey Thick Liquid: Not tested   Puree Puree: Impaired Presentation: Spoon Oral Phase Impairments: (none) Oral Phase Functional Implications: (none) Pharyngeal Phase Impairments: Multiple swallows;Other (comments)(effortful)   Solid      Solid: Not tested  Nicholas Contreras 01/13/2019,2:37 PM   Nicholas Contreras.Ed Risk analyst [phone] Office [phone]

## 2019-01-13 NOTE — Progress Notes (Signed)
Pt very agitated and intermittently confused, refusing to answer questions- HR jumping into 140-160's but not sustaining.   Pt in restraints; called stroke and received new order for 250cc bolus.

## 2019-01-13 NOTE — Progress Notes (Signed)
PT Cancellation Note  Patient Details Name: NAFTULA DONAHUE MRN: [id number] DOB: [birth year]   Cancelled Treatment:    Reason Eval/Treat Not Completed: Patient not medically ready Will follow.   Marguarite Arbour A Werner Labella 01/13/2019, 7:13 AM Wray Kearns, PT, DPT Acute Rehabilitation Services Pager [phone] Office [phone]

## 2019-01-13 NOTE — Progress Notes (Addendum)
Pt getting aggressive verbally and physically. Swatting at [employer] and saying he is going to murder [place]. Provider notified.  Security called to help [place] calm him and get him in dry linens and restraints. Son Catalina Antigua arrived at this time. He was educated about the restraints. Haldol given per order. Pt was calm and resting for the remainder of shift.

## 2019-01-13 NOTE — Evaluation (Signed)
Physical Therapy Evaluation Patient Details Name: Nicholas Contreras MRN: [id number] DOB: 27-Nov-1928 Today's Date: 01/13/2019   History of Present Illness  Patient is a 85 y/o male who presents with right sided weakness and aphasia. CTA- Left M1 occlusion s/p mechanical thrombectomy 8/17 achieving revascularization. Brain MRI-patchy infarcts left basal ganglia and subcortical left frontal lobe. PMH includes A-fib not on anticoagulation, HTN, HLD.  Clinical Impression  Patient presents with right sided weakness, right incoordination, visual deficits (mostly in right visual field?), imbalance, dysarthria and impaired mobility s/p above. Pt independent PTA and cares for wife with dementia. Has assist from son for driving. Today, pt requires Mod A for bed mobility and transfers with difficulty sequencing and coordinating movement through right side. Also noted to have poor awareness of deficits/safety and impaired problem solving. HR up to 173 bpm, A-fib during session. Would benefit from CIR to maximize independence and mobility prior to return home. Will follow acutely.    Follow Up Recommendations CIR;Supervision for mobility/OOB;Supervision/Assistance - 24 hour    Equipment Recommendations  Other (comment)(defer)    Recommendations for Other Services Rehab consult     Precautions / Restrictions Precautions Precautions: Fall Restrictions Weight Bearing Restrictions: No      Mobility  Bed Mobility Overal bed mobility: Needs Assistance Bed Mobility: Supine to Sit     Supine to sit: Mod assist;HOB elevated     General bed mobility comments: Assist needed with RLE and trunk to get to EOB: heavy left lateral lean with pt reaching for rail on right side of body with left hand twisting around with pt almost falling off bed without awareness.  Transfers Overall transfer level: Needs assistance Equipment used: Rolling walker (2 wheeled) Transfers: Sit to/from [employer] Sit to Stand: Mod assist;+2 physical assistance Stand pivot transfers: Mod assist;+2 physical assistance       General transfer comment: Assist of 2 to power to standing with pt needing manual assist to place RUE on walker handle; heavy left lateral lean with BLEs locked out into extension. Able to take afew steps to get to chair with assist for RW management and coordination/movement of RLE. Trying to sit prematurely. HR up to 173 bpm, A-fib.  Ambulation/Gait             General Gait Details: Deferred due to heart rate.  Stairs            Wheelchair Mobility    Modified Rankin (Stroke Patients Only) Modified Rankin (Stroke Patients Only) Pre-Morbid Rankin Score: No significant disability Modified Rankin: Moderately severe disability     Balance Overall balance assessment: Needs assistance Sitting-balance support: Feet supported;Single extremity supported Sitting balance-Leahy Scale: Poor Sitting balance - Comments: Left lateral lean with Mod A initially progressing to close min guard with BUe support on RW for balance.   Standing balance support: During functional activity Standing balance-Leahy Scale: Poor Standing balance comment: Mod A for static standing balance and RW with left lateral lean.                             Pertinent Vitals/Pain Pain Assessment: No/denies pain    Home Living Family/patient expects to be discharged to:: Inpatient rehab Living Arrangements: Spouse/significant other Available Help at Discharge: Family;Available 24 hours/day(wife has dementia per chart) Type of Home: House Home Access: Level entry     Home Layout: One level Home Equipment: None      Prior Function Level of  Independence: Independent         Comments: Does not drive anymore but does cooking/cleaning.Cares for self. Son helps with transportation. per chart, helps care for wife who has dementia.     Hand Dominance   Dominant Hand:  Right    Extremity/Trunk Assessment   Upper Extremity Assessment Upper Extremity Assessment: Defer to OT evaluation(impaired sensation RUE)    Lower Extremity Assessment Lower Extremity Assessment: RLE deficits/detail RLE Deficits / Details: Grossly ~3/5 throughout RLE Sensation: WNL       Communication   Communication: Expressive difficulties  Cognition Arousal/Alertness: Awake/alert Behavior During Therapy: Impulsive Overall Cognitive Status: Impaired/Different from baseline Area of Impairment: Orientation;Awareness;Safety/judgement;Following commands;Problem solving                 Orientation Level: Disoriented to;Time     Following Commands: Follows one step commands consistently(repetition needed at times) Safety/Judgement: Decreased awareness of safety;Decreased awareness of deficits Awareness: Intellectual Problem Solving: Difficulty sequencing;Requires verbal cues General Comments: Initially thinks it is October and then able to self correct with contextual cues. Poor awareness of deficits. impulsive with all movements. Decreased awareness of right side.      General Comments General comments (skin integrity, edema, etc.): HR ranged from 90-173 bpm, A-fib.    Exercises     Assessment/Plan    PT Assessment Patient needs continued PT services  PT Problem List Decreased strength;Decreased mobility;Impaired sensation;Decreased balance;Decreased activity tolerance;Decreased cognition;Decreased coordination;Decreased safety awareness       PT Treatment Interventions DME instruction;Therapeutic activities;Therapeutic exercise;Gait training;Patient/family education;Balance training;Neuromuscular re-education;Functional mobility training;Cognitive remediation    PT Goals (Current goals can be found in the Care Plan section)  Acute Rehab PT Goals Patient Stated Goal: "to talk to my son" PT Goal Formulation: With patient Time For Goal Achievement:  01/27/19 Potential to Achieve Goals: Fair    Frequency Min 4X/week   Barriers to discharge Decreased caregiver support      Co-evaluation               AM-PAC PT "6 Clicks" Mobility  Outcome Measure Help needed turning from your back to your side while in a flat bed without using bedrails?: A Lot Help needed moving from lying on your back to sitting on the side of a flat bed without using bedrails?: A Lot Help needed moving to and from a bed to a chair (including a wheelchair)?: A Lot Help needed standing up from a chair using your arms (e.g., wheelchair or bedside chair)?: A Lot Help needed to walk in hospital room?: A Lot Help needed climbing 3-5 steps with a railing? : Total 6 Click Score: 11    End of Session Equipment Utilized During Treatment: Gait belt Activity Tolerance: Treatment limited secondary to medical complications (Comment);Patient tolerated treatment well(HR) Patient left: in chair;with call bell/phone within reach;with chair alarm set Nurse Communication: Mobility status PT Visit Diagnosis: Hemiplegia and hemiparesis;Unsteadiness on feet (R26.81);Difficulty in walking, not elsewhere classified (R26.2) Hemiplegia - Right/Left: Right Hemiplegia - dominant/non-dominant: Dominant Hemiplegia - caused by: Cerebral infarction    Time: 3875-6433 PT Time Calculation (min) (ACUTE ONLY): 19 min   Charges:   PT Evaluation $PT Eval Moderate Complexity: 1 Mod          Wray Kearns, PT, DPT Acute Rehabilitation Services Pager [phone] Office [place] 01/13/2019, 12:36 PM

## 2019-01-13 NOTE — Progress Notes (Signed)
STROKE TEAM PROGRESS NOTE   INTERVAL HISTORY I have personally reviewed history of presenting illness, electronic medical records and imaging films in PACS.  Patient presented with aphasia and right hemiplegia with left M1 occlusion and underwent mechanical thrombectomy successfully.  He is extubated.  He has moderate dysarthria and mild weakness of right hand but is otherwise doing well.  Blood pressure well controlled.  MRI scan shows only tiny left basal ganglia and left frontal subcortical infarcts.  Echocardiogram is unremarkable.  LDL cholesterol is slightly elevated at 87 mg percent and hemoglobin A1c is 5.1.  Patient is unable to tell me why he was not on anticoagulation but denies any recent history of bleeding, fall risk or injuries  Vitals:   01/13/19 0500 01/13/19 0600 01/13/19 0605 01/13/19 0700  BP: 127/82 118/77 120/78   Pulse: (!) 156 (!) 57 68 (!) 102  Resp: (!) 23 16 (!) 23 (!) 22  Temp:      TempSrc:      SpO2: 97% (!) 85% 95% 93%  Weight:      Height:        CBC:  Recent Labs  Lab 01/12/19 0747 01/12/19 0801 01/13/19 0212  WBC 8.2  --  7.7  NEUTROABS 6.6  --  5.9  HGB 15.1 16.0 12.3*  HCT 44.4 47.0 36.9*  MCV 105.7*  --  106.0*  PLT 702*  --  604*    Basic Metabolic Panel:  Recent Labs  Lab 01/12/19 0747 01/12/19 0801 01/13/19 0212  NA 135 138 136  K 3.5 3.8 3.8  CL 102 104 107  CO2 21*  --  21*  GLUCOSE 108* 102* 87  BUN 16 21 11   CREATININE 1.37* 1.40* 1.22  CALCIUM 9.4  --  8.5*   Lipid Panel:     Component Value Date/Time   CHOL 145 01/13/2019 0212   TRIG 97 01/13/2019 0212   HDL 39 (L) 01/13/2019 0212   CHOLHDL 3.7 01/13/2019 0212   VLDL 19 01/13/2019 0212   LDLCALC 87 01/13/2019 0212   HgbA1c:  Lab Results  Component Value Date   HGBA1C 5.1 01/13/2019   Urine Drug Screen:     Component Value Date/Time   LABOPIA NONE DETECTED 01/12/2019 0747   COCAINSCRNUR NONE DETECTED 01/12/2019 0747   LABBENZ NONE DETECTED 01/12/2019 0747    AMPHETMU NONE DETECTED 01/12/2019 0747   THCU NONE DETECTED 01/12/2019 0747   LABBARB NONE DETECTED 01/12/2019 0747    Alcohol Level     Component Value Date/Time   ETH <10 01/12/2019 0747    IMAGING Ct Head Code Stroke Wo Contrast 01/12/2019 1. Equivocal for hyperdense left MCA. No hemorrhage or visible acute infarct.ASPECTS is 10. 2. Prominent atrophy and chronic small vessel ischemia.   Ct Code Stroke Cta Head W/wo Contrast Ct Code Stroke Cta Neck W/wo Contrast 01/12/2019 1. Left M1 embolus/occlusion with downstream reconstitution. There is 92 cc penumbra with no core infarct by CT perfusion. 2. Limited atheromatous changes.   Ct Code Stroke Cta Cerebral Perfusion W/wo Contrast 01/12/2019 1. Left M1 embolus/occlusion with downstream reconstitution. There is 92 cc penumbra with no core infarct by CT perfusion. 2. Limited atheromatous changes.   Cerebral Angiogram S/P lt common carotid arteriogram followed bt complete revascularization of occluded Lt MCA at origin with x 1 pass with 44mm x 40 mm solitaire x  Retriever  device achieving a TICI 3 revascularization.  Mr Brain Wo Contrast 01/13/2019 1. Patchy small volume acute ischemic nonhemorrhagic infarcts  involving the left basal ganglia and subcortical left frontal lobe as above. 2. Underlying age-related cerebral atrophy with moderate chronic microvascular ischemic disease.   2D Echocardiogram  1. The left ventricle has hyperdynamic systolic function, with an ejection fraction of >65%. The cavity size was normal. There is mildly increased left ventricular wall thickness. Left ventricular diastolic Doppler parameters are indeterminate. No  evidence of left ventricular regional wall motion abnormalities.  2. The right ventricle has normal systolic function. The cavity was normal. There is no increase in right ventricular wall thickness.  3. Left atrial size was moderately dilated.  4. The pericardial effusion is localized near the  right atrium.  5. Trivial pericardial effusion is present.  6. Tricuspid valve regurgitation is moderate.  7. The aortic valve is tricuspid. Mild thickening of the aortic valve. Mild calcification of the aortic valve. Aortic valve regurgitation is mild by color flow Doppler.  8. The aorta is normal unless otherwise noted.  9. No intracardiac thrombi or masses were visualized.   PHYSICAL EXAM Frail elderly Caucasian male not in distress.  He is hard of hearing. . Afebrile. Head is nontraumatic. Neck is supple without bruit.    Cardiac exam no murmur or gallop. Lungs are clear to auscultation. Distal pulses are well felt. Neurological Exam ; Awake alert dysarthric but can be understood.  Follows commands well.  Oriented to place and person.  Diminished attention, registration and recall.  Extraocular movements are full range without nystagmus.  Blinks to threat bilaterally.  Right lower facial weakness.  Tongue midline.  Motor system exam shows subtle right upper extremity drift with weakness of right grip and intrinsic hand muscles.  Orbits left over right upper extremity.  Symmetric lower extremity strength.  Sensation is intact bilaterally.  Deep tendon flexes are symmetric.  Finger-to-nose coordination is impaired on the right only.  Plantars are downgoing.  Gait not tested. NIH stroke scale 6 ASSESSMENT/PLAN [name] is a 85 y.o. male with history of thrombocythemia, hyperlipidemia, hypertension, atrial fibrillation not on anticoagulation presenting with aphasia and R sided weakness.   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 No acute abnormality. Small vessel disease. Atrophy. ASPECTS 10.     CTA head & neck L M1 occlusion  CT perfusion 92cc penumbra w/ no core infarct  Cerebral angio L MCA occlusion at origin w/ TICI3 revascularization w/ 1 pass solitaire  Post IR CT No gross ICH  or mass effect.  MRI  Patchy small L basal ganglia and subcoritcal L frontal lobe infarcts. Small vessel disease. Atrophy.   2D Echo EF >65%. No source of embolus. LA mod dilated.   LDL 87  HgbA1c 5.1  SCDs for VTE prophylaxis  aspirin 81 mg daily prior to admission, now on aspirin 300 mg rectally   therapy recommendations:  CIR  Disposition:  pending  (caregiver for wife who has dementia)  Transfer to the floor  Atrial Fibrillation  Home anticoagulation:  none   Last INR 1.2 . add Eliquis (apixaban) daily at discharge   Hypertension  Home meds:  Tenormin 50, enalapril 20 bid, HCTZ 12.5  Stable BP goal per IR x 24h following IR administration .  Long-term BP goal normotensive  Hyperlipidemia  Home meds:  pravachol 80  LDL 87, goal < 70  Resume Pravachol 80 when able to swallow  Continue statin at  discharge  Other Stroke Risk Factors  Advanced age  Hx smokeless tobacco user  Hx ETOH use  Other Active Problems  BPH on flomax  Essential Thrombocythemia on Hydroxyurea and plendil PTA, PLT [facility] day # 1  I have personally obtained history,examined this patient, reviewed notes, independently viewed imaging studies, participated in medical decision making and plan of care.ROS completed by me personally and pertinent positives fully documented  I have made any additions or clarifications directly to the above note.  He presented with embolic left MCA occlusion and underwent successful mechanical thrombectomy and has made good clinical recovery but has residual dysarthria and mild right face and hand weakness.  Recommend check swallow eval.  Mobilize out of bed.  Therapy consults.  Start aspirin for now till he is able to swallow then switch to Eliquis for anticoagulation for his A. fib.  Long discussion with patient and answered questions.  I tried calling patient's listed home number to speak to his wife but there was no reply and no answering machine.   Will consider transfer to the floor bed later today if stable This patient is critically ill and at significant risk of neurological worsening, death and care requires constant monitoring of vital signs, hemodynamics,respiratory and cardiac monitoring, extensive review of multiple databases, frequent neurological assessment, discussion with family, other specialists and medical decision making of high complexity.I have made any additions or clarifications directly to the above note.This critical care time does not reflect procedure time, or teaching time or supervisory time of PA/NP/Med Resident etc but could involve care discussion time.  I spent 30 minutes of neurocritical care time  in the care of  this patient.     Antony Contras, MD Medical Director [facility] Pager: [phone] 01/13/2019 1:13 PM   To contact Stroke Continuity provider, please refer to [email or web address] After hours, contact General Neurology

## 2019-01-13 NOTE — Progress Notes (Signed)
Referring Physician(s): Code Stroke- Aroor, Karena Addison R  Supervising Physician: Luanne Bras  Patient Status:  [facility] - In-pt  Chief Complaint: None  Subjective:  Left MCA occlusion s/p emergent mechanical thrombectomy achieving a TICI 3 revascularization 01/12/2019 by Dr. Estanislado Pandy. Patient awake and alert laying in bed with no complaints at this time. Can spontaneously move all extremities. Speech mildly dysarthric. Right groin incision c/d/i.   Allergies: Codeine  Medications: Prior to Admission medications   Medication Sig Start Date End Date Taking? Authorizing Provider  aspirin 81 MG tablet Take 81 mg by mouth daily.   Yes [provider]  atenolol (TENORMIN) 50 MG tablet Take 50 mg by mouth daily.   Yes [provider]  enalapril (VASOTEC) 20 MG tablet TAKE 1 TABLET TWICE A DAY Patient taking differently: Take 20 mg by mouth 2 (two) times daily.  03/05/18  Yes Glendale Chard, MD  ergocalciferol (VITAMIN D2) 50000 UNITS capsule Take 50,000 Units by mouth once a week.   Yes [provider]  felodipine (PLENDIL) 10 MG 24 hr tablet TAKE 1 TABLET DAILY Patient taking differently: Take 10 mg by mouth daily.  06/16/18  Yes Glendale Chard, MD  hydroxyurea (HYDREA) 500 MG capsule TAKE 1 CAPSULE DAILY WITH FOOD TO MINIMIZE GASTROINTESTINAL SIDE EFFECTS Patient taking differently: Take 500 mg by mouth daily. TAKE 1 CAPSULE DAILY WITH FOOD TO MINIMIZE GASTROINTESTINAL SIDE EFFECTS 06/04/18  Yes Shadad, Mathis Dad, MD  NON FORMULARY Take 1 tablet by mouth daily. Super Beta Prostate formula   Yes [provider]  pravastatin (PRAVACHOL) 80 MG tablet TAKE 1 TABLET DAILY Patient taking differently: Take 80 mg by mouth daily.  11/04/18  Yes Glendale Chard, MD  Tamsulosin HCl (FLOMAX) 0.4 MG CAPS Take 0.4 mg by mouth daily.   Yes [provider]  hydrochlorothiazide (HYDRODIURIL) 12.5 MG tablet Take 12.5 mg by mouth daily.    [provider]     Vital Signs: BP 135/81    Pulse 89    Temp 97.8 F (36.6 C) (Oral)    Resp 19    Ht 5\' 10"  (1.778 m)    Wt 175 lb 7.8 oz (79.6 kg)    SpO2 96%    BMI 25.18 kg/m   Physical Exam Vitals signs and nursing note reviewed.  Constitutional:      General: He is not in acute distress.    Appearance: Normal appearance.  Pulmonary:     Effort: Pulmonary effort is normal. No respiratory distress.  Skin:    General: Skin is warm and dry.     Comments: Right groin incision soft without active bleeding or hematoma.  Neurological:     Mental Status: He is alert.     Comments: Alert, awake, and oriented x3. Speech mildly dysarthric, comprehension intact. No facial asymmetry. Tongue midline. Can spontaneously move all extremities. No pronator drift. Distal pulses 1+ bilaterally.  Psychiatric:        Mood and Affect: Mood normal.        Behavior: Behavior normal.        Thought Content: Thought content normal.        Judgment: Judgment normal.     Imaging: Ct Code Stroke Cta Head W/wo Contrast  Result Date: 01/12/2019 CLINICAL DATA:  Code stroke.  Right weakness with speech difficulty EXAM: CT ANGIOGRAPHY HEAD AND NECK CT PERFUSION BRAIN TECHNIQUE: Multidetector CT imaging of the head and neck was performed using the standard protocol during bolus administration  of intravenous contrast. Multiplanar CT image reconstructions and MIPs were obtained to evaluate the vascular anatomy. Carotid stenosis measurements (when applicable) are obtained utilizing NASCET criteria, using the distal internal carotid diameter as the denominator. Multiphase CT imaging of the brain was performed following IV bolus contrast injection. Subsequent parametric perfusion maps were calculated using RAPID software. CONTRAST:  182mL OMNIPAQUE IOHEXOL 300 MG/ML  SOLN COMPARISON:  Noncontrast head CT earlier today FINDINGS: CTA NECK FINDINGS Aortic arch: Mild atherosclerotic plaque.  Two vessel branching Right carotid  system: Vessels are smooth and widely patent. Limited atheromatous changes. Left carotid system: Vessels are smooth and widely patent. Limited atheromatous changes. Vertebral arteries: No proximal subclavian stenosis. Slight left vertebral artery dominance. No branch occlusion or beading. Skeleton: Degenerative changes without acute finding. Other neck: Right thyroid nodules without invasive features or adenopathy, likely incidental given patient age. Upper chest: No acute finding Review of the MIP images confirms the above findings CTA HEAD FINDINGS Anterior circulation: Calcified plaque along the carotid siphons. Left MCA occlusion with abrupt cut off. There is downstream reconstitution with underfilling. Negative for aneurysm Posterior circulation: Vertebral arteries are smooth and widely patent. Patent bilateral PICA. Tiny distal basilar in the setting of bilateral fetal type PCA. Venous sinuses: Patent Anatomic variants: As above Review of the MIP images confirms the above findings CT Brain Perfusion Findings: ASPECTS: 10 CBF (<30%) Volume: 33mL Perfusion (Tmax>6.0s) volume: 79mL Mismatch Volume: 44mL These results were communicated to Dr. Lorraine Lax at 8:40 amon 8/17/2020by text page via the [facility] messaging system. A catheter angiogram/embolectomy has already been ordered. IMPRESSION: 1. Left M1 embolus/occlusion with downstream reconstitution. There is 92 cc penumbra with no core infarct by CT perfusion. 2. Limited atheromatous changes. Electronically Signed   By: Monte Fantasia M.D.   On: 01/12/2019 08:46   Ct Code Stroke Cta Neck W/wo Contrast  Result Date: 01/12/2019 CLINICAL DATA:  Code stroke.  Right weakness with speech difficulty EXAM: CT ANGIOGRAPHY HEAD AND NECK CT PERFUSION BRAIN TECHNIQUE: Multidetector CT imaging of the head and neck was performed using the standard protocol during bolus administration of intravenous contrast. Multiplanar CT image reconstructions and MIPs were obtained to evaluate  the vascular anatomy. Carotid stenosis measurements (when applicable) are obtained utilizing NASCET criteria, using the distal internal carotid diameter as the denominator. Multiphase CT imaging of the brain was performed following IV bolus contrast injection. Subsequent parametric perfusion maps were calculated using RAPID software. CONTRAST:  184mL OMNIPAQUE IOHEXOL 300 MG/ML  SOLN COMPARISON:  Noncontrast head CT earlier today FINDINGS: CTA NECK FINDINGS Aortic arch: Mild atherosclerotic plaque.  Two vessel branching Right carotid system: Vessels are smooth and widely patent. Limited atheromatous changes. Left carotid system: Vessels are smooth and widely patent. Limited atheromatous changes. Vertebral arteries: No proximal subclavian stenosis. Slight left vertebral artery dominance. No branch occlusion or beading. Skeleton: Degenerative changes without acute finding. Other neck: Right thyroid nodules without invasive features or adenopathy, likely incidental given patient age. Upper chest: No acute finding Review of the MIP images confirms the above findings CTA HEAD FINDINGS Anterior circulation: Calcified plaque along the carotid siphons. Left MCA occlusion with abrupt cut off. There is downstream reconstitution with underfilling. Negative for aneurysm Posterior circulation: Vertebral arteries are smooth and widely patent. Patent bilateral PICA. Tiny distal basilar in the setting of bilateral fetal type PCA. Venous sinuses: Patent Anatomic variants: As above Review of the MIP images confirms the above findings CT Brain Perfusion Findings: ASPECTS: 10 CBF (<30%) Volume: 7mL Perfusion (Tmax>6.0s) volume:  4mL Mismatch Volume: 35mL These results were communicated to Dr. Lorraine Lax at 8:40 amon 8/17/2020by text page via the [facility] messaging system. A catheter angiogram/embolectomy has already been ordered. IMPRESSION: 1. Left M1 embolus/occlusion with downstream reconstitution. There is 92 cc penumbra with no core  infarct by CT perfusion. 2. Limited atheromatous changes. Electronically Signed   By: Monte Fantasia M.D.   On: 01/12/2019 08:46   Mr Brain Wo Contrast  Result Date: 01/13/2019 CLINICAL DATA:  Follow-up examination for acute stroke. EXAM: MRI HEAD WITHOUT CONTRAST TECHNIQUE: Multiplanar, multiecho pulse sequences of the brain and surrounding structures were obtained without intravenous contrast. COMPARISON:  Prior CTs from 12/12/2018. FINDINGS: Brain: Diffuse prominence of the CSF containing spaces compatible with generalized age-related cerebral atrophy. Patchy and confluent T2/FLAIR hyperintensity within the periventricular deep white matter both cerebral hemispheres most consistent with chronic small vessel ischemic disease, moderate in nature. Superimposed remote lacunar infarct involving the periventricular white matter adjacent to the frontal horn of the left lateral ventricle noted. Abnormal restricted diffusion involving the left basal ganglia, extending from the left lentiform nucleus into the left caudate noted, compatible with acute ischemic infarct. Additional subcentimeter acute ischemic infarcts seen involving the subcortical white matter of the high left frontal lobe (series 5, image 89). No associated hemorrhage or mass effect. No other evidence for acute or subacute ischemia. Gray-white matter differentiation otherwise maintained. No other areas of remote cortical infarction. No foci of susceptibility artifact to suggest acute or chronic intracranial hemorrhage. No mass lesion, midline shift or mass effect. Diffuse ventricular prominence related to global parenchymal volume loss of hydrocephalus. No extra-axial fluid collection. Pituitary gland suprasellar region within normal limits. Midline structures intact. Vascular: Major intracranial vascular flow voids are maintained. Skull and upper cervical spine: Craniocervical junction within normal limits. Bone marrow signal intensity normal. No  scalp soft tissue abnormality. Sinuses/Orbits: Patient status post bilateral ocular lens replacement. Mild chronic mucoperiosteal thickening noted within the ethmoidal air cells. Paranasal sinuses are otherwise clear. No mastoid effusion. Inner ear structures grossly normal. Other: None. IMPRESSION: 1. Patchy small volume acute ischemic nonhemorrhagic infarcts involving the left basal ganglia and subcortical left frontal lobe as above. 2. Underlying age-related cerebral atrophy with moderate chronic microvascular ischemic disease. Electronically Signed   By: Jeannine Boga M.D.   On: 01/13/2019 03:45   Ct Code Stroke Cta Cerebral Perfusion W/wo Contrast  Result Date: 01/12/2019 CLINICAL DATA:  Code stroke.  Right weakness with speech difficulty EXAM: CT ANGIOGRAPHY HEAD AND NECK CT PERFUSION BRAIN TECHNIQUE: Multidetector CT imaging of the head and neck was performed using the standard protocol during bolus administration of intravenous contrast. Multiplanar CT image reconstructions and MIPs were obtained to evaluate the vascular anatomy. Carotid stenosis measurements (when applicable) are obtained utilizing NASCET criteria, using the distal internal carotid diameter as the denominator. Multiphase CT imaging of the brain was performed following IV bolus contrast injection. Subsequent parametric perfusion maps were calculated using RAPID software. CONTRAST:  170mL OMNIPAQUE IOHEXOL 300 MG/ML  SOLN COMPARISON:  Noncontrast head CT earlier today FINDINGS: CTA NECK FINDINGS Aortic arch: Mild atherosclerotic plaque.  Two vessel branching Right carotid system: Vessels are smooth and widely patent. Limited atheromatous changes. Left carotid system: Vessels are smooth and widely patent. Limited atheromatous changes. Vertebral arteries: No proximal subclavian stenosis. Slight left vertebral artery dominance. No branch occlusion or beading. Skeleton: Degenerative changes without acute finding. Other neck: Right  thyroid nodules without invasive features or adenopathy, likely incidental given patient age. Upper chest: No  acute finding Review of the MIP images confirms the above findings CTA HEAD FINDINGS Anterior circulation: Calcified plaque along the carotid siphons. Left MCA occlusion with abrupt cut off. There is downstream reconstitution with underfilling. Negative for aneurysm Posterior circulation: Vertebral arteries are smooth and widely patent. Patent bilateral PICA. Tiny distal basilar in the setting of bilateral fetal type PCA. Venous sinuses: Patent Anatomic variants: As above Review of the MIP images confirms the above findings CT Brain Perfusion Findings: ASPECTS: 10 CBF (<30%) Volume: 68mL Perfusion (Tmax>6.0s) volume: 58mL Mismatch Volume: 13mL These results were communicated to Dr. Lorraine Lax at 8:40 amon 8/17/2020by text page via the [facility] messaging system. A catheter angiogram/embolectomy has already been ordered. IMPRESSION: 1. Left M1 embolus/occlusion with downstream reconstitution. There is 92 cc penumbra with no core infarct by CT perfusion. 2. Limited atheromatous changes. Electronically Signed   By: Monte Fantasia M.D.   On: 01/12/2019 08:46   Ct Head Code Stroke Wo Contrast  Result Date: 01/12/2019 CLINICAL DATA:  Code stroke. Right-sided weakness and slurred speech EXAM: CT HEAD WITHOUT CONTRAST TECHNIQUE: Contiguous axial images were obtained from the base of the skull through the vertex without intravenous contrast. COMPARISON:  None. FINDINGS: Brain: No evidence of acute infarction, hemorrhage, hydrocephalus, extra-axial collection or mass lesion/mass effect. Advanced chronic small vessel ischemic change in the cerebral white matter. Central predominant atrophy. Vascular: Atherosclerotic calcification. Equivocal for hyperdense left M1 segment. Skull: No acute or aggressive finding Sinuses/Orbits: Bilateral cataract resection Other: These results were communicated to Dr. Lorraine Lax at [place]  8/17/2020by text page via the [facility] messaging system. ASPECTS [facility] Stroke Program Early CT Score) - Ganglionic level infarction (caudate, lentiform nuclei, internal capsule, insula, M1-M3 cortex): 7 - Supraganglionic infarction (M4-M6 cortex): 3 Total score (0-10 with 10 being normal): 10 IMPRESSION: 1. Equivocal for hyperdense left MCA. No hemorrhage or visible acute infarct.ASPECTS is 10. 2. Prominent atrophy and chronic small vessel ischemia. Electronically Signed   By: Monte Fantasia M.D.   On: 01/12/2019 08:17    Labs:  CBC: Recent Labs    01/12/19 0747 01/12/19 0801 01/13/19 0212  WBC 8.2  --  7.7  HGB 15.1 16.0 12.3*  HCT 44.4 47.0 36.9*  PLT 702*  --  604*    COAGS: Recent Labs    01/12/19 0747  INR 1.2  APTT 29    BMP: Recent Labs    01/12/19 0747 01/12/19 0801 01/13/19 0212  NA 135 138 136  K 3.5 3.8 3.8  CL 102 104 107  CO2 21*  --  21*  GLUCOSE 108* 102* 87  BUN 16 21 11   CALCIUM 9.4  --  8.5*  CREATININE 1.37* 1.40* 1.22  GFRNONAA 46*  --  53*  GFRAA 53*  --  >60    LIVER FUNCTION TESTS: Recent Labs    01/12/19 0747  BILITOT 1.5*  AST 25  ALT 20  ALKPHOS 60  PROT 7.5  ALBUMIN 4.2    Assessment and Plan:  Left MCA occlusion s/p emergent mechanical thrombectomy achieving a TICI 3 revascularization 01/12/2019 by Dr. Estanislado Pandy. Patient's condition improved- can spontaneously move all extremities, speech mildly dysarthric. Right groin incision stable, distal pulses 1+ bilaterally. Further plans per neurology- appreciate and agree with management. Please call NIR with questions/concerns.   Electronically Signed: Earley Abide, PA-C 01/13/2019, 11:18 AM   I spent a total of 25 Minutes at the the patient's bedside AND on the patient's hospital floor or unit, greater than 50% of which was  counseling/coordinating care for left MCA occlusion s/p revascularization.

## 2019-01-14 ENCOUNTER — Inpatient Hospital Stay (HOSPITAL_COMMUNITY): Payer: Medicare Other

## 2019-01-14 ENCOUNTER — Encounter (HOSPITAL_COMMUNITY): Payer: Self-pay | Admitting: Emergency Medicine

## 2019-01-14 MED ORDER — RESOURCE THICKENUP CLEAR PO POWD
ORAL | Status: DC | PRN
  Filled 2019-01-14 (×2): qty 125

## 2019-01-14 MED ORDER — METOPROLOL TARTRATE 5 MG/5ML IV SOLN
2.5000 mg | Freq: Once | INTRAVENOUS | Status: DC | PRN
  Filled 2019-01-14: qty 5

## 2019-01-14 MED ORDER — QUETIAPINE FUMARATE 25 MG PO TABS
12.5000 mg | ORAL_TABLET | Freq: Every evening | ORAL | Status: DC | PRN
  Administered 2019-01-20 – 2019-01-23 (×3): 12.5 mg via ORAL
  Filled 2019-01-14 (×3): qty 1

## 2019-01-14 MED ORDER — METOPROLOL TARTRATE 5 MG/5ML IV SOLN
2.5000 mg | Freq: Once | INTRAVENOUS | Status: DC
  Filled 2019-01-14: qty 5

## 2019-01-14 NOTE — Progress Notes (Signed)
Inpatient Rehab Admissions:  Inpatient Rehab Consult received.  I met with pt at the bedside for rehabilitation assessment. Pt alert, oriented, and appropriate upon assessment-which was much different than attempt yesterday. Pt sounds interested in CIR program. Will need to discuss with his family to confirm type of support at DC. Noted pt's HR very variable while AC in room and HR limited therapy session this AM.   Will follow up with family today and see how pt progresses with therapy over the next day or two.   Please call if questions.   Jhonnie Garner, OTR/L  Rehab Admissions Coordinator  [phone] 01/14/2019 10:46 AM

## 2019-01-14 NOTE — Progress Notes (Signed)
STROKE TEAM PROGRESS NOTE   INTERVAL HISTORY Patient became agitated last night possibly from sundowning but is calm awake and interactive this morning.  Continues to have dysarthria and some word finding difficulties and right hand weakness.  Vital signs are stable.  Therapist recommend inpatient rehab.  He was unable to swallow and is currently n.p.o. but plans of to do modified barium today  Vitals:   01/14/19 0200 01/14/19 0310 01/14/19 0851 01/14/19 1053  BP:  134/72 (!) 147/78 (!) 145/86  Pulse: 86 90 70 89  Resp:   13 15  Temp: 97.9 F (36.6 C) 98.1 F (36.7 C) 98.3 F (36.8 C) 98.6 F (37 C)  TempSrc: Axillary Axillary Oral Oral  SpO2: 94% 96% 100% 98%  Weight:      Height:        CBC:  Recent Labs  Lab 01/12/19 0747 01/12/19 0801 01/13/19 0212  WBC 8.2  --  7.7  NEUTROABS 6.6  --  5.9  HGB 15.1 16.0 12.3*  HCT 44.4 47.0 36.9*  MCV 105.7*  --  106.0*  PLT 702*  --  604*    Basic Metabolic Panel:  Recent Labs  Lab 01/12/19 0747 01/12/19 0801 01/13/19 0212  NA 135 138 136  K 3.5 3.8 3.8  CL 102 104 107  CO2 21*  --  21*  GLUCOSE 108* 102* 87  BUN 16 21 11   CREATININE 1.37* 1.40* 1.22  CALCIUM 9.4  --  8.5*   Lipid Panel:     Component Value Date/Time   CHOL 145 01/13/2019 0212   TRIG 97 01/13/2019 0212   HDL 39 (L) 01/13/2019 0212   CHOLHDL 3.7 01/13/2019 0212   VLDL 19 01/13/2019 0212   LDLCALC 87 01/13/2019 0212   HgbA1c:  Lab Results  Component Value Date   HGBA1C 5.1 01/13/2019   Urine Drug Screen:     Component Value Date/Time   LABOPIA NONE DETECTED 01/12/2019 0747   COCAINSCRNUR NONE DETECTED 01/12/2019 0747   LABBENZ NONE DETECTED 01/12/2019 0747   AMPHETMU NONE DETECTED 01/12/2019 0747   THCU NONE DETECTED 01/12/2019 0747   LABBARB NONE DETECTED 01/12/2019 0747    Alcohol Level     Component Value Date/Time   ETH <10 01/12/2019 0747    IMAGING Ct Head Code Stroke Wo Contrast 01/12/2019 1. Equivocal for hyperdense left  MCA. No hemorrhage or visible acute infarct.ASPECTS is 10. 2. Prominent atrophy and chronic small vessel ischemia.   Ct Code Stroke Cta Head W/wo Contrast Ct Code Stroke Cta Neck W/wo Contrast 01/12/2019 1. Left M1 embolus/occlusion with downstream reconstitution. There is 92 cc penumbra with no core infarct by CT perfusion. 2. Limited atheromatous changes.   Ct Code Stroke Cta Cerebral Perfusion W/wo Contrast 01/12/2019 1. Left M1 embolus/occlusion with downstream reconstitution. There is 92 cc penumbra with no core infarct by CT perfusion. 2. Limited atheromatous changes.   Cerebral Angiogram S/P lt common carotid arteriogram followed bt complete revascularization of occluded Lt MCA at origin with x 1 pass with 4mm x 40 mm solitaire x  Retriever  device achieving a TICI 3 revascularization.  Mr Brain Wo Contrast 01/13/2019 1. Patchy small volume acute ischemic nonhemorrhagic infarcts involving the left basal ganglia and subcortical left frontal lobe as above. 2. Underlying age-related cerebral atrophy with moderate chronic microvascular ischemic disease.   2D Echocardiogram  1. The left ventricle has hyperdynamic systolic function, with an ejection fraction of >65%. The cavity size was normal. There is mildly  increased left ventricular wall thickness. Left ventricular diastolic Doppler parameters are indeterminate. No  evidence of left ventricular regional wall motion abnormalities.  2. The right ventricle has normal systolic function. The cavity was normal. There is no increase in right ventricular wall thickness.  3. Left atrial size was moderately dilated.  4. The pericardial effusion is localized near the right atrium.  5. Trivial pericardial effusion is present.  6. Tricuspid valve regurgitation is moderate.  7. The aortic valve is tricuspid. Mild thickening of the aortic valve. Mild calcification of the aortic valve. Aortic valve regurgitation is mild by color flow Doppler.  8. The  aorta is normal unless otherwise noted.  9. No intracardiac thrombi or masses were visualized.   PHYSICAL EXAM    Frail elderly Caucasian male not in distress.  He is hard of hearing. . Afebrile. Head is nontraumatic. Neck is supple without bruit.    Cardiac exam no murmur or gallop. Lungs are clear to auscultation. Distal pulses are well felt. Neurological Exam ; Awake alert dysarthric but can be understood.  Follows commands well.  Oriented to place and person.  Diminished attention, registration and recall.  Extraocular movements are full range without nystagmus.  Blinks to threat bilaterally.  Right lower facial weakness.  Tongue midline.  Motor system exam shows subtle right upper extremity drift with weakness of right grip and intrinsic hand muscles.  Orbits left over right upper extremity.  Symmetric lower extremity strength.  Sensation is intact bilaterally.  Deep tendon flexes are symmetric.  Finger-to-nose coordination is impaired on the right only.  Plantars are downgoing.  Gait not tested. NIH stroke scale 6   ASSESSMENT/PLAN [name] is a 85 y.o. male with history of thrombocythemia, hyperlipidemia, hypertension, atrial fibrillation not on anticoagulation presenting with aphasia and R sided weakness.   Stroke:   L basal ganglia infarct s/p IR occluded L MCA - embolic secondary to known AF not on [facility]  Code Stroke CT head hyperdense L MCA. No acute abnormality. Small vessel disease. Atrophy. ASPECTS 10.     CTA head & neck L M1 occlusion  CT perfusion 92cc penumbra w/ no core infarct  Cerebral angio L MCA occlusion at origin w/ TICI3 revascularization w/ 1 pass solitaire  Post IR CT No gross ICH or mass effect.  MRI  Patchy small L basal ganglia and subcoritcal L frontal lobe infarcts. Small vessel disease. Atrophy.   2D Echo EF >65%. No source of embolus. LA mod dilated.   LDL 87  HgbA1c 5.1  SCDs for VTE prophylaxis  aspirin 81 mg daily prior to admission,  now on aspirin 300 mg rectally. Plan Eliquis once able to swallow.   therapy recommendations:  CIR  Disposition:  pending  (caregiver for wife who has dementia)  Atrial Fibrillation  Home anticoagulation:  none   Last INR 1.2 . add Eliquis (apixaban) daily at discharge   Hypertension Tachycardia  Home meds:  Tenormin 50, enalapril 20 bid, HCTZ 12.5  Stable  Received 1 dose metoprolol during the night. Has prn metoprolol ordered.  Resume home meds once able to swallow  Long-term BP goal normotensive  Hyperlipidemia  Home meds:  pravachol 80  LDL 87, goal < 70  Resume Pravachol 80 when able to swallow  Continue statin at discharge  Dysphagia . Secondary to stroke . NPO . Speech on board . For MBSS today . Decrease IVF to 50/hr   Other Stroke Risk Factors  Advanced age  Hx  smokeless tobacco user  Hx ETOH use  Other Active Problems  BPH on flomax  Essential Thrombocythemia on Hydroxyurea and plendil PTA, PLT 604  Agitation - likely sundowning on 8/18 requiring restraints and haldol - 8/19 resolved - will add seroquel hs prn  Check labs in am  Hospital day # 2 Patient remains n.p.o. and will continue aspirin for now but switch to Eliquis when able to swallow safely.  Continue ongoing therapy evaluation and transfer to inpatient rehab after insurance approval and bed available.  May use Seroquel 12.5 mg as needed at night for agitation and to help sleep.  No family available at the bedside for discussion.  Greater than 50% time during this 25-minute visit was spent on counseling and coordination of care and discussion with care team.  Antony Contras, MD Medical Director [place] Pager: [phone] 01/14/2019 11:54 AM   To contact Stroke Continuity provider, please refer to [email or web address] After hours, contact General Neurology

## 2019-01-14 NOTE — Consult Note (Signed)
[place] Nurse wound consult note Patient receiving care in [facility] 3W02.  Primary RN, Nira Conn, to room for left groin assessment with me. Reason for Consult: "Black area in IR site on L groin" Wound type: The left groin has had some type of procedural "puncture" at some point.  The very edges of the puncture site are black in color and the site is "oozing" a very small amount of serosanginous as evidenced by the spots on the sheet.  There are no overt s/s of infection to the area. Dressing procedure/placement/frequency: Apply an ordinary bandaid to the area and monitor it for deterioration.   Monitor the wound area(s) for worsening of condition such as: Signs/symptoms of infection,  Increase in size,  Development of or worsening of odor, Development of pain, or increased pain at the affected locations.  Notify the medical team if any of these develop.  Thank you for the consult.  Discussed plan of care with the patient and bedside nurse.  [place] nurse will not follow at this time.  Please re-consult the [place] team if needed.  Val Riles, RN, MSN, CWOCN, CNS-BC, pager [phone]

## 2019-01-14 NOTE — Progress Notes (Signed)
Modified Barium Swallow Progress Note  Patient Details  Name: Nicholas Contreras MRN: [id number] Date of Birth: 12-12-1928  Today's Date: 01/14/2019  Modified Barium Swallow completed.  Full report located under Chart Review in the Imaging Section.  Brief recommendations include the following:  Clinical Impression           Pt exhibited mild oropharyngeal dysphagia marked by penetration and aspiration of nectar barium. Ability to effectively contain and transit boluses was reduced leading to diffusion in oral cavity and prolonged mastication. Pharyngeal phase marked by impaired timing of laryngeal protective mechanisms resulting in penetration to vocal cords with nectar followed by trace silent aspiration during second swallow. Use of chin tuck was effective for majority of nectar barium trials and is recommended as a compensatory technique. Mild-moderate residue present given reduced tongue base retraction that decreased with verbal cues for subsequent swallows. Recommend Dys 2 texture (family to bring dentures), nectar thick liquids, chin tuck with liquids, pills whole in puree, volitional throat clear and assist with feeding and strategies. SLP will continue intervention for safest intake and advancement.         Swallow Evaluation Recommendations         Dys 2, nectar liquids        Frequency: 2x week for 2 weeks                       [name] 01/14/2019,4:56 PM   Orbie Pyo [email or web address] Risk analyst [phone] Office [phone]

## 2019-01-14 NOTE — Progress Notes (Signed)
Physical Therapy Treatment Patient Details Name: Nicholas Contreras MRN: [id number] DOB: [birth year] Today's Date: 01/14/2019    History of Present Illness Patient is a 85 y/o male who presents with right sided weakness and aphasia. CTA- Left M1 occlusion s/p mechanical thrombectomy 8/17 achieving revascularization. Brain MRI-patchy infarcts left basal ganglia and subcortical left frontal lobe. PMH includes A-fib not on anticoagulation, HTN, HLD.    PT Comments    Patient seen for mobility progression. Pt is pleasant and cooperative throughout session and reports he is eager to mobilize. Pt is oriented to place, situation, and able to state correct month/year with cues. Pt requires min guard/min A for bed mobility, supervision/min guard for sitting balance EOB, and min A +2 for lateral scoots up to [place] in sitting. OOB mobility deferred due to HR up to 178 bpm in sitting. HR 90-178 bpm, A-fib, and pt asymptomatic. Continue to progress as tolerated and recommend CIR for further skilled PT services to maximize independence and safety with mobility.     Follow Up Recommendations  CIR;Supervision for mobility/OOB;Supervision/Assistance - 24 hour     Equipment Recommendations  Other (comment)(defer)    Recommendations for Other Services       Precautions / Restrictions Precautions Precautions: Fall Restrictions Weight Bearing Restrictions: No    Mobility  Bed Mobility Overal bed mobility: Needs Assistance Bed Mobility: Supine to Sit;Sit to Supine     Supine to sit: HOB elevated;Min assist Sit to supine: Min guard   General bed mobility comments: cues for sequencing and assist to elevate trunk into sitting   Transfers                 General transfer comment: pt able to laterally scoot up to [facility] with min A +2 and use of bed pad; no LOB; OOB transfers deferred due to HR up to 178 bpm, A-fib, in sitting EOB   Ambulation/Gait             General Gait Details: Deferred due  to heart rate.   Stairs             Wheelchair Mobility    Modified Rankin (Stroke Patients Only) Modified Rankin (Stroke Patients Only) Pre-Morbid Rankin Score: No significant disability Modified Rankin: Moderately severe disability     Balance Overall balance assessment: Needs assistance Sitting-balance support: Feet supported;Single extremity supported Sitting balance-Leahy Scale: Fair Sitting balance - Comments: pt demonstrates improved sitting balance requiring min guard/supervision for safety                                    Cognition Arousal/Alertness: Awake/alert Behavior During Therapy: WFL for tasks assessed/performed Overall Cognitive Status: Impaired/Different from baseline Area of Impairment: Safety/judgement;Following commands;Problem solving                       Following Commands: Follows one step commands consistently     Problem Solving: Requires verbal cues;Difficulty sequencing General Comments: pt initially stated incorrect year but able to self correct and able to state month with cues      Exercises      General Comments General comments (skin integrity, edema, etc.): HR ranged from 90-178 bpm A-fib      Pertinent Vitals/Pain Pain Assessment: No/denies pain Faces Pain Scale: No hurt    Home Living  Prior Function            PT Goals (current goals can now be found in the care plan section) Progress towards PT goals: Progressing toward goals    Frequency    Min 4X/week      PT Plan Current plan remains appropriate    Co-evaluation              AM-PAC PT "6 Clicks" Mobility   Outcome Measure  Help needed turning from your back to your side while in a flat bed without using bedrails?: A Little Help needed moving from lying on your back to sitting on the side of a flat bed without using bedrails?: A Little Help needed moving to and from a bed to a chair  (including a wheelchair)?: A Little Help needed standing up from a chair using your arms (e.g., wheelchair or bedside chair)?: A Lot Help needed to walk in hospital room?: A Lot Help needed climbing 3-5 steps with a railing? : Total 6 Click Score: 14    End of Session   Activity Tolerance: Treatment limited secondary to medical complications (Comment);Patient tolerated treatment well(HR) Patient left: with call bell/phone within reach;in bed;with bed alarm set Nurse Communication: Mobility status PT Visit Diagnosis: Hemiplegia and hemiparesis;Unsteadiness on feet (R26.81);Difficulty in walking, not elsewhere classified (R26.2) Hemiplegia - Right/Left: Right Hemiplegia - dominant/non-dominant: Dominant Hemiplegia - caused by: Cerebral infarction     Time: 5056-9794 PT Time Calculation (min) (ACUTE ONLY): 30 min  Charges:  $Therapeutic Activity: 8-22 mins                     Earney Navy, PTA Acute Rehabilitation Services Pager: [phone] Office: [phone]     Darliss Cheney 01/14/2019, 9:39 AM

## 2019-01-14 NOTE — Evaluation (Addendum)
Occupational Therapy Evaluation Patient Details Name: Nicholas Contreras MRN: [id number] DOB: 1929-03-17 Today's Date: 01/14/2019    History of Present Illness Patient is a 85 y/o male who presents with right sided weakness and aphasia. CTA- Left M1 occlusion s/p mechanical thrombectomy 8/17 achieving revascularization. Brain MRI-patchy infarcts left basal ganglia and subcortical left frontal lobe. PMH includes A-fib not on anticoagulation, HTN, HLD.   Clinical Impression   This 85 y/o male presents with the above. 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 Pt pleasant and appropriate during this session, though session limited to sitting EOB only due to pt in A-fib and with elevated HR, fluctuating between 90s- high170s (briefly up to 170s, x2 occasions, pt asymptomatic). Pt was able to sustain sitting balance with close minguard assist today using UE support; suspect he will require increased assist for standing balance and functional transfers. He currently demonstrates RUE deficits including decreased coordination, dysmetria. Pt currently requires mod hand over hand assist for seated grooming ADL using RUE, modA for UB ADL. He will benefit from continued acute OT services and feel he is an excellent candidate for CIR level therapies at time of discharge to maximize his safety and independence with ADL and mobility. Will follow.     Follow Up Recommendations  CIR;Supervision/Assistance - 24 hour    Equipment Recommendations  Other (comment)(TBD in next venue)           Precautions / Restrictions Precautions Precautions: Fall Precaution Comments: monitor HR Restrictions Weight Bearing Restrictions: No      Mobility Bed Mobility Overal bed mobility: Needs Assistance Bed Mobility: Supine to Sit;Sit to Supine     Supine to sit: HOB elevated;Min assist Sit to supine: Min guard   General bed mobility comments: cues for sequencing and assist  to elevate trunk into sitting   Transfers                 General transfer comment: pt able to laterally scoot up to [facility] with min A +2 and use of bed pad; no LOB; OOB transfers deferred due to HR up to 178 bpm, A-fib, in sitting EOB     Balance Overall balance assessment: Needs assistance Sitting-balance support: Feet supported;Single extremity supported Sitting balance-Leahy Scale: Fair Sitting balance - Comments: pt demonstrates improved sitting balance requiring min guard/supervision for safety                                   ADL either performed or assessed with clinical judgement   ADL Overall ADL's : Needs assistance/impaired Eating/Feeding: NPO   Grooming: Wash/dry face;Oral care;Moderate assistance;Sitting Grooming Details (indicate cue type and reason): requires hand over hand assist for incorporating RUE into task due to discoordination  Upper Body Bathing: Moderate assistance;Sitting       Upper Body Dressing : Moderate assistance;Sitting                   Functional mobility during ADLs: Minimal assistance;+2 for safety/equipment;+2 for physical assistance(bed mobility only) General ADL Comments: session limited due to pt in a-fib and very tachy (up to 170s briefly, x2 occasions), sat EOB >5 min with minguard assist prior to returning to supine due to HR; suspect pt will require increased assist with standing/functional tasks beyond EOB     Vision Baseline Vision/History: (reports vision "not great" at baseline) Patient Visual Report: Blurring of vision(mild  blurring) Vision Assessment?: Yes Eye Alignment: Within Functional Limits Tracking/Visual Pursuits: Decreased smoothness of horizontal tracking;Decreased smoothness of vertical tracking Additional Comments: to be further assessed     Perception     Praxis      Pertinent Vitals/Pain Pain Assessment: No/denies pain Faces Pain Scale: No hurt     Hand Dominance Right    Extremity/Trunk Assessment Upper Extremity Assessment Upper Extremity Assessment: Generalized weakness;RUE deficits/detail RUE Deficits / Details: pt with discoordination and dysmetria noted, AROM appears WFL, denies sensation impairments RUE Coordination: decreased fine motor   Lower Extremity Assessment Lower Extremity Assessment: Defer to PT evaluation       Communication Communication Communication: Expressive difficulties;HOH   Cognition Arousal/Alertness: Awake/alert Behavior During Therapy: WFL for tasks assessed/performed Overall Cognitive Status: Impaired/Different from baseline Area of Impairment: Safety/judgement;Following commands;Problem solving                       Following Commands: Follows one step commands consistently     Problem Solving: Requires verbal cues;Difficulty sequencing General Comments: pt initially stated incorrect year but able to self correct and able to state month with cues; overall pleasant and appropriate today, following most commands   General Comments  HR ranged from 90-178 bpm A-fib    Exercises     Shoulder Instructions      Home Living Family/patient expects to be discharged to:: Private residence Living Arrangements: Spouse/significant other Available Help at Discharge: Family;Available 24 hours/day(wife has dementia per chart) Type of Home: House Home Access: Level entry     Home Layout: One level               Home Equipment: None          Prior Functioning/Environment Level of Independence: Independent        Comments: Does not drive anymore but does cooking/cleaning.Cares for self. Son helps with transportation. per chart, helps care for wife who has dementia.        OT Problem List: Decreased strength;Decreased range of motion;Decreased activity tolerance;Impaired balance (sitting and/or standing);Impaired vision/perception;Decreased cognition;Decreased safety awareness;Impaired UE functional  use;Decreased coordination;Decreased knowledge of use of DME or AE      OT Treatment/Interventions: Self-care/ADL training;Therapeutic exercise;Neuromuscular education;DME and/or AE instruction;Therapeutic activities;Patient/family education;Balance training;Cognitive remediation/compensation;Visual/perceptual remediation/compensation;Modalities    OT Goals(Current goals can be found in the care plan section) Acute Rehab OT Goals Patient Stated Goal: "to talk to my son" OT Goal Formulation: With patient Time For Goal Achievement: 01/28/19 Potential to Achieve Goals: Good  OT Frequency: Min 2X/week   Barriers to D/C:            Co-evaluation PT/OT/SLP Co-Evaluation/Treatment: Yes Reason for Co-Treatment: Complexity of the patient's impairments (multi-system involvement);For patient/therapist safety;Necessary to address cognition/behavior during functional activity   OT goals addressed during session: ADL's and self-care      AM-PAC OT "6 Clicks" Daily Activity     Outcome Measure Help from another person eating meals?: Total(NPO) Help from another person taking care of personal grooming?: A Lot Help from another person toileting, which includes using toliet, bedpan, or urinal?: Total Help from another person bathing (including washing, rinsing, drying)?: A Lot Help from another person to put on and taking off regular upper body clothing?: A Lot Help from another person to put on and taking off regular lower body clothing?: Total 6 Click Score: 9   End of Session Nurse Communication: Mobility status  Activity Tolerance: Patient tolerated treatment well;Treatment limited secondary to medical  complications (Comment)(in A-fib) Patient left: in bed;with call bell/phone within reach;with bed alarm set  OT Visit Diagnosis: Muscle weakness (generalized) (M62.81);Other symptoms and signs involving the nervous system (R29.898)                Time: 3736-6815 OT Time Calculation (min):  29 min Charges:  OT General Charges $OT Visit: 1 Visit OT Evaluation $OT Eval Moderate Complexity: 1 Mod  Lou Cal, OT [employer] Pager [phone] Office [phone]   Raymondo Band 01/14/2019, 11:08 AM

## 2019-01-15 LAB — BASIC METABOLIC PANEL
Anion gap: 12 (ref 5–15)
BUN: 12 mg/dL (ref 8–23)
CO2: 19 mmol/L — ABNORMAL LOW (ref 22–32)
Calcium: 8.6 mg/dL — ABNORMAL LOW (ref 8.9–10.3)
Chloride: 103 mmol/L (ref 98–111)
Creatinine, Ser: 0.95 mg/dL (ref 0.61–1.24)
GFR calc Af Amer: >60 mL/min (ref >60)
GFR calc non Af Amer: >60 mL/min (ref >60)
Glucose, Bld: 96 mg/dL (ref 70–99)
Potassium: 3.5 mmol/L (ref 3.5–5.1)
Sodium: 134 mmol/L — ABNORMAL LOW (ref 135–145)

## 2019-01-15 LAB — CBC
HCT: 39.8 % (ref 39.0–52.0)
Hemoglobin: 13.8 g/dL (ref 13.0–17.0)
MCH: 35.5 pg — ABNORMAL HIGH (ref 26.0–34.0)
MCHC: 34.7 g/dL (ref 30.0–36.0)
MCV: 102.3 fL — ABNORMAL HIGH (ref 80.0–100.0)
Platelets: 660 10*3/uL — ABNORMAL HIGH (ref 150–400)
RBC: 3.89 MIL/uL — ABNORMAL LOW (ref 4.22–5.81)
RDW: 12.6 % (ref 11.5–15.5)
WBC: 9.7 10*3/uL (ref 4.0–10.5)
nRBC: 0 % (ref 0.0–0.2)

## 2019-01-15 MED ORDER — PRAVASTATIN SODIUM 40 MG PO TABS
80.0000 mg | ORAL_TABLET | Freq: Every day | ORAL | Status: DC
  Administered 2019-01-15 – 2019-01-26 (×12): 80 mg via ORAL
  Filled 2019-01-15 (×12): qty 2

## 2019-01-15 MED ORDER — TAMSULOSIN HCL 0.4 MG PO CAPS
0.4000 mg | ORAL_CAPSULE | Freq: Every day | ORAL | Status: DC
  Administered 2019-01-15 – 2019-01-26 (×12): 0.4 mg via ORAL
  Filled 2019-01-15 (×12): qty 1

## 2019-01-15 MED ORDER — APIXABAN 5 MG PO TABS
5.0000 mg | ORAL_TABLET | Freq: Two times a day (BID) | ORAL | Status: DC
  Administered 2019-01-15 – 2019-01-18 (×7): 5 mg via ORAL
  Filled 2019-01-15 (×7): qty 1

## 2019-01-15 MED ORDER — METOPROLOL TARTRATE 5 MG/5ML IV SOLN
2.5000 mg | Freq: Four times a day (QID) | INTRAVENOUS | Status: DC | PRN
  Administered 2019-01-15: 2.5 mg via INTRAVENOUS

## 2019-01-15 MED ORDER — ATENOLOL 25 MG PO TABS
50.0000 mg | ORAL_TABLET | Freq: Every day | ORAL | Status: DC
  Administered 2019-01-15 – 2019-01-26 (×12): 50 mg via ORAL
  Filled 2019-01-15 (×12): qty 2

## 2019-01-15 MED ORDER — HYDROXYUREA 500 MG PO CAPS
500.0000 mg | ORAL_CAPSULE | Freq: Every day | ORAL | Status: DC
  Administered 2019-01-16 – 2019-01-26 (×11): 500 mg via ORAL
  Filled 2019-01-15 (×12): qty 1

## 2019-01-15 MED ORDER — ENALAPRIL MALEATE 20 MG PO TABS
20.0000 mg | ORAL_TABLET | Freq: Two times a day (BID) | ORAL | Status: DC
  Administered 2019-01-15 – 2019-01-19 (×9): 20 mg via ORAL
  Filled 2019-01-15 (×11): qty 1

## 2019-01-15 NOTE — Care Management Important Message (Signed)
Important Message  Patient Details  Name: Nicholas Contreras MRN: [id number] Date of Birth: 09-01-1928   Medicare Important Message Given:  Yes     Orbie Pyo 01/15/2019, 2:41 PM

## 2019-01-15 NOTE — Progress Notes (Signed)
Physical Therapy Treatment Patient Details Name: Nicholas Contreras MRN: [id number] DOB: [birth year] Today's Date: 01/15/2019    History of Present Illness Patient is a 85 y/o male who presents with right sided weakness and aphasia. CTA- Left M1 occlusion s/p mechanical thrombectomy 8/17 achieving revascularization. Brain MRI-patchy infarcts left basal ganglia and subcortical left frontal lobe. PMH includes A-fib not on anticoagulation, HTN, HLD.    PT Comments    Patient progressing with OOB to [facility] and able to take steps to [facility] with RW from [facility].  Noted R gaze preference, though able to look to L with cues.  Still with some issues of decreased deficit awareness though talking about his limitations, the next sentence he talks about going home tomorrow.  Feel patient will need follow up post acute inpatient rehab prior to d/c home.   Follow Up Recommendations  CIR;Supervision for mobility/OOB;Supervision/Assistance - 24 hour     Equipment Recommendations  Rolling walker with 5" wheels    Recommendations for Other Services       Precautions / Restrictions Precautions Precautions: Fall Precaution Comments: monitor HR    Mobility  Bed Mobility Overal bed mobility: Needs Assistance Bed Mobility: Supine to Sit     Supine to sit: HOB elevated;Min assist Sit to supine: Mod assist   General bed mobility comments: assist to lift trunk; once supine assist for positioning hips and shoulders initial minA for geting leg onto bed  Transfers Overall transfer level: Needs assistance Equipment used: Rolling walker (2 wheeled) Transfers: Sit to/from [facility] Sit to Stand: Mod assist Stand pivot transfers: Mod assist;+2 safety/equipment       General transfer comment: HR variable throughout session, but pt asymptomatic; stood to adjust sheets on bed as fitted sheet loose from edges, but pt +for BM and pivoted to [facility] with RW and mod A. In standing tech assisted with hygiene and  pt stood with RW and min A for balance, took steps about 3' toward [facility] then sat EOB  Ambulation/Gait Ambulation/Gait assistance: Mod assist Gait Distance (Feet): 3 Feet Assistive device: Rolling walker (2 wheeled) Gait Pattern/deviations: Step-to pattern;Trunk flexed     General Gait Details: walking toward HOB from [place] Mobility    Modified Rankin (Stroke Patients Only) Modified Rankin (Stroke Patients Only) Pre-Morbid Rankin Score: No significant disability Modified Rankin: Moderately severe disability     Balance Overall balance assessment: Needs assistance   Sitting balance-Leahy Scale: Poor Sitting balance - Comments: leaning back initially then able to sit with S, but returned to posterior lean corrected with min A and cues Postural control: Posterior lean Standing balance support: Bilateral upper extremity supported Standing balance-Leahy Scale: Poor Standing balance comment: min A                            Cognition Arousal/Alertness: Awake/alert Behavior During Therapy: Restless Overall Cognitive Status: Impaired/Different from baseline Area of Impairment: Attention;Memory;Safety/judgement;Following commands                   Current Attention Level: Sustained   Following Commands: Follows one step commands consistently;Follows one step commands with increased time Safety/Judgement: Decreased awareness of safety;Decreased awareness of deficits   Problem Solving: Requires verbal cues;Difficulty sequencing General Comments: mumbling at times incoherently but then able to make out he is frustrated about his level of mobility 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 and could help her, now hard to lift himself, but then states going home tomorrow      Exercises      General Comments General comments (skin integrity, edema, etc.): HR range 95-195 during transfer to [facility] but RN reports a lot  of artifact at times and pt not symptomatic      Pertinent Vitals/Pain Pain Assessment: No/denies pain    Home Living                      Prior Function            PT Goals (current goals can now be found in the care plan section) Progress towards PT goals: Progressing toward goals    Frequency    Min 4X/week      PT Plan Current plan remains appropriate    Co-evaluation              AM-PAC PT "6 Clicks" Mobility   Outcome Measure  Help needed turning from your back to your side while in a flat bed without using bedrails?: A Little Help needed moving from lying on your back to sitting on the side of a flat bed without using bedrails?: A Little Help needed moving to and from a bed to a chair (including a wheelchair)?: A Lot Help needed standing up from a chair using your arms (e.g., wheelchair or bedside chair)?: A Lot Help needed to walk in hospital room?: Total Help needed climbing 3-5 steps with a railing? : Total 6 Click Score: 12    End of Session   Activity Tolerance: Treatment limited secondary to medical complications (Comment)(due to elevated HR) Patient left: in bed;with call bell/phone within reach;with bed alarm set   PT Visit Diagnosis: Hemiplegia and hemiparesis;Unsteadiness on feet (R26.81);Difficulty in walking, not elsewhere classified (R26.2) Hemiplegia - Right/Left: Right Hemiplegia - dominant/non-dominant: Dominant Hemiplegia - caused by: Cerebral infarction     Time: 1035-1100 PT Time Calculation (min) (ACUTE ONLY): 25 min  Charges:  $Therapeutic Activity: 23-37 mins                     Nicholas Contreras, Nicholas Contreras Acute Rehabilitation Services [phone] 01/15/2019    Nicholas Contreras 01/15/2019, 12:00 PM

## 2019-01-15 NOTE — Progress Notes (Signed)
  Speech Language Pathology Treatment: Dysphagia  Patient Details Name: Nicholas Contreras MRN: [id number] DOB: Nov 30, 1928 Today's Date: 01/15/2019 Time: 6073-7106 SLP Time Calculation (min) (ACUTE ONLY): 23 min  Assessment / Plan / Recommendation Clinical Impression  Pt's mental status decreased since the previous session, resulting in increased cueing needed during the session. He remembered the word "down" for what to do when he drinks, but when given honey thick lemonade he required max cueing and would not perform the chin tuck without tactile cues. When asked questions he would provide tangential, incoherent answers. He drank his lemonade with his right hand independently. Minimal throat clear was noted after drinking, with no other signs of aspiration. When given 2 diced peaches, he had prolonged mastication and minimal left pocketing. Recommend continuing current dys 2 nectar thick diet with full supervision and reminder to chin tuck with liquids.   HPI HPI: DAMETRI Contreras is a 85 y.o. male admitted with speech disturbance, aphasia and decreased mobility. MRI Patchy small volume acute ischemic nonhemorrhagic infarcts involving the left basal ganglia and subcortical left frontal lobe as above, underlying age-related cerebral atrophy with moderate chronic microvascular ischemic disease.      SLP Plan  Continue with current plan of care       Recommendations  Diet recommendations: Nectar-thick liquid;Dysphagia 2 (fine chop) Liquids provided via: Cup Medication Administration: Crushed with puree Supervision: Staff to assist with self feeding;Full supervision/cueing for compensatory strategies Compensations: Chin tuck;Clear throat intermittently;Slow rate;Small sips/bites Postural Changes and/or Swallow Maneuvers: Chin tuck                Oral Care Recommendations: Oral care BID Follow up Recommendations: Inpatient Rehab SLP Visit Diagnosis: Dysphagia, oropharyngeal phase  (R13.12) Plan: Continue with current plan of care                       Darril Patriarca 01/15/2019, 3:06 PM

## 2019-01-15 NOTE — Progress Notes (Signed)
Inpatient Rehabilitation-Admissions Coordinator   Spoke with son [name] over the phone regarding recommended support at DC. [name] is unsure if the patient will have the anticipated physical assist needed at DC to support a short CIR stay. He is requesting AC follow up with him tomorrow morning after he has a chance to speak with his entire family regarding this decision.   Will follow up.   Jhonnie Garner, OTR/L  Rehab Admissions Coordinator  [phone] 01/15/2019 2:36 PM

## 2019-01-15 NOTE — Significant Event (Signed)
Rapid Response Event Note  Overview: Time Called: 0825 Arrival Time: 0830 Event Type: MEWS  Initial Focused Assessment: NT documented HR 144.  RN called because of change in MEWS.  Per RN patient at baseline, no acute change.  Upon review of telemetry HR actually artifact.  Patient HR fluctuating 80-110s AF.  NP ordered PRN dose of Lopressor q 6 hours.   No increase in VS indicated.    01/15/19 0746  MEWS Score  Resp (!) 22  ECG Heart Rate (!) 144  Pulse Rate (!) 104  BP (!) 162/94  Temp 98.2 F (36.8 C)  SpO2 98 %  O2 Device Room Air  MEWS Score  MEWS RR 1  MEWS Pulse 3  MEWS Systolic 0  MEWS LOC 0  MEWS Temp 0  MEWS Score 4  MEWS Score Color Red      Raliegh Ip 01/15/2019,9:19 AM  Interventions:  Plan of Care (if not transferred):  Event Summary: Name of Physician Notified: Ivin Booty PA (stroke) at [place]    at    Outcome: Stayed in room and stabalized  Event End Time: 8250  Raliegh Ip

## 2019-01-15 NOTE — Progress Notes (Signed)
ANTICOAGULATION CONSULT NOTE - Initial Consult  Pharmacy Consult for Apixaban Indication: atrial fibrillation  Allergies  Allergen Reactions  . Codeine     Per son, severity unknown    Patient Measurements: Height: 5\' 10"  (177.8 cm) Weight: 175 lb 7.8 oz (79.6 kg) IBW/kg (Calculated) : 73   Vital Signs: Temp: 98.2 F (36.8 C) (08/20 0746) Temp Source: Axillary (08/20 0746) BP: 162/94 (08/20 0746) Pulse Rate: 104 (08/20 0746)  Labs: Recent Labs    01/13/19 0212 01/15/19 0326  HGB 12.3* 13.8  HCT 36.9* 39.8  PLT 604* 660*  CREATININE 1.22 0.95    Estimated Creatinine Clearance: 55.5 mL/min (by C-G formula based on SCr of 0.95 mg/dL).   Medical History: Past Medical History:  Diagnosis Date  . BPH (benign prostatic hyperplasia)   . Essential thrombocythemia ([place])   . Hyperlipidemia   . Hypertension   . Osteoporosis     Assessment: [name] is a 85 y.o. male with history of thrombocythemia, hyperlipidemia, hypertension, atrial fibrillation not on anticoagulation presenting with aphasia and R sided weakness.  Found to have a Stroke:   L basal ganglia infarct s/p IR occluded L MCA - embolic secondary to known AF not on AC.  Pharmacy consulted to initiate Apixaban dosing.  85yo, 79.6 kg and Scr 0.95   Plan:  Initiate Apixaban at 5 mg po bid  Alanda Slim, PharmD, [facility] Clinical Pharmacist Please see AMION for all Pharmacists' Contact Phone Numbers 01/15/2019, 8:23 AM

## 2019-01-15 NOTE — Progress Notes (Signed)
STROKE TEAM PROGRESS NOTE   INTERVAL HISTORY Patient passed swallow eval yesterday with speech therapy.  He remains intermittently agitated.  He states he wants to go home.  Therapy team have evaluated him and recommended rehab  Vitals:   01/15/19 0310 01/15/19 0746 01/15/19 0836 01/15/19 1121  BP: 140/86 (!) 162/94    Pulse: (!) 109 (!) 104  100  Resp: 18 (!) 22 17   Temp: 98 F (36.7 C) 98.2 F (36.8 C)  98 F (36.7 C)  TempSrc: Oral Axillary  Axillary  SpO2: 98% 98%  97%  Weight:      Height:        CBC:  Recent Labs  Lab 01/12/19 0747  01/13/19 0212 01/15/19 0326  WBC 8.2  --  7.7 9.7  NEUTROABS 6.6  --  5.9  --   HGB 15.1   < > 12.3* 13.8  HCT 44.4   < > 36.9* 39.8  MCV 105.7*  --  106.0* 102.3*  PLT 702*  --  604* 660*   < > = values in this interval not displayed.    Basic Metabolic Panel:  Recent Labs  Lab 01/13/19 0212 01/15/19 0326  NA 136 134*  K 3.8 3.5  CL 107 103  CO2 21* 19*  GLUCOSE 87 96  BUN 11 12  CREATININE 1.22 0.95  CALCIUM 8.5* 8.6*   Lipid Panel:     Component Value Date/Time   CHOL 145 01/13/2019 0212   TRIG 97 01/13/2019 0212   HDL 39 (L) 01/13/2019 0212   CHOLHDL 3.7 01/13/2019 0212   VLDL 19 01/13/2019 0212   LDLCALC 87 01/13/2019 0212   HgbA1c:  Lab Results  Component Value Date   HGBA1C 5.1 01/13/2019   Urine Drug Screen:     Component Value Date/Time   LABOPIA NONE DETECTED 01/12/2019 0747   COCAINSCRNUR NONE DETECTED 01/12/2019 0747   LABBENZ NONE DETECTED 01/12/2019 0747   AMPHETMU NONE DETECTED 01/12/2019 0747   THCU NONE DETECTED 01/12/2019 0747   LABBARB NONE DETECTED 01/12/2019 0747    Alcohol Level     Component Value Date/Time   ETH <10 01/12/2019 0747    IMAGING Ct Head Code Stroke Wo Contrast 01/12/2019 1. Equivocal for hyperdense left MCA. No hemorrhage or visible acute infarct.ASPECTS is 10. 2. Prominent atrophy and chronic small vessel ischemia.   Ct Code Stroke Cta Head W/wo Contrast Ct  Code Stroke Cta Neck W/wo Contrast 01/12/2019 1. Left M1 embolus/occlusion with downstream reconstitution. There is 92 cc penumbra with no core infarct by CT perfusion. 2. Limited atheromatous changes.   Ct Code Stroke Cta Cerebral Perfusion W/wo Contrast 01/12/2019 1. Left M1 embolus/occlusion with downstream reconstitution. There is 92 cc penumbra with no core infarct by CT perfusion. 2. Limited atheromatous changes.   Cerebral Angiogram S/P lt common carotid arteriogram followed bt complete revascularization of occluded Lt MCA at origin with x 1 pass with 42mm x 40 mm solitaire x  Retriever  device achieving a TICI 3 revascularization.  Mr Brain Wo Contrast 01/13/2019 1. Patchy small volume acute ischemic nonhemorrhagic infarcts involving the left basal ganglia and subcortical left frontal lobe as above. 2. Underlying age-related cerebral atrophy with moderate chronic microvascular ischemic disease.   2D Echocardiogram  1. The left ventricle has hyperdynamic systolic function, with an ejection fraction of >65%. The cavity size was normal. There is mildly increased left ventricular wall thickness. Left ventricular diastolic Doppler parameters are indeterminate. No  evidence of left  ventricular regional wall motion abnormalities.  2. The right ventricle has normal systolic function. The cavity was normal. There is no increase in right ventricular wall thickness.  3. Left atrial size was moderately dilated.  4. The pericardial effusion is localized near the right atrium.  5. Trivial pericardial effusion is present.  6. Tricuspid valve regurgitation is moderate.  7. The aortic valve is tricuspid. Mild thickening of the aortic valve. Mild calcification of the aortic valve. Aortic valve regurgitation is mild by color flow Doppler.  8. The aorta is normal unless otherwise noted.  9. No intracardiac thrombi or masses were visualized.   PHYSICAL EXAM    Frail elderly Caucasian male not in  distress.  He is hard of hearing. . Afebrile. Head is nontraumatic. Neck is supple without bruit.    Cardiac exam no murmur or gallop. Lungs are clear to auscultation. Distal pulses are well felt. Neurological Exam ; Awake alert dysarthric but can be understood.  Follows commands well.  Oriented to place and person.  Diminished attention, registration and recall.  Extraocular movements are full range without nystagmus.  Blinks to threat bilaterally.  Right lower facial weakness.  Tongue midline.  Motor system exam shows subtle right upper extremity drift with weakness of right grip and intrinsic hand muscles.  Orbits left over right upper extremity.  Symmetric lower extremity strength.  Sensation is intact bilaterally.  Deep tendon flexes are symmetric.  Finger-to-nose coordination is impaired on the right only.  Plantars are downgoing.  Gait not tested. NIH stroke scale 6   ASSESSMENT/PLAN Mr. MARCELL CHAVARIN is a 85 y.o. male with history of thrombocythemia, hyperlipidemia, hypertension, atrial fibrillation not on anticoagulation presenting with aphasia and R sided weakness.   Stroke:   L basal ganglia infarct s/p IR occluded L MCA - embolic secondary to known AF not on [facility]  Code Stroke CT head hyperdense L MCA. No acute abnormality. Small vessel disease. Atrophy. ASPECTS 10.     CTA head & neck L M1 occlusion  CT perfusion 92cc penumbra w/ no core infarct  Cerebral angio L MCA occlusion at origin w/ TICI3 revascularization w/ 1 pass solitaire  Post IR CT No gross ICH or mass effect.  MRI  Patchy small L basal ganglia and subcoritcal L frontal lobe infarcts. Small vessel disease. Atrophy.   2D Echo EF >65%. No source of embolus. LA mod dilated.   LDL 87  HgbA1c 5.1  SCDs for VTE prophylaxis  aspirin 81 mg daily prior to admission, started on Eliquis   therapy recommendations:  CIR  Disposition:  pending  (caregiver for wife who has dementia)  Atrial Fibrillation  Home  anticoagulation:  none   Last INR 1.2 . added Eliquis (apixaban) daily    Hypertension Tachycardia  Home meds:  Tenormin 50, enalapril 20 bid, HCTZ 12.5  Stable  HR up to 140s this am without home meds for several days. Elevated MEWS. No change in neuro exam.  Treated with metoprolol prn and resumed home meds. HR down to 104.  Long-term BP goal normotensive  Hyperlipidemia  Home meds:  pravachol 80  LDL 87, goal < 70  Resumed Pravachol 80   Continue statin at discharge  Dysphagia . Secondary to stroke . Cleared for D2 nectar thick liquid diet w/ MBSS  . on VF to 50/hr . Speech on board   Other Stroke Risk Factors  Advanced age  Hx smokeless tobacco user  Hx ETOH use  Other Active Problems  BPH on flomax  Essential Thrombocythemia on Hydroxyurea and plendil PTA, PLT 604  Agitation - likely sundowning on 8/18 requiring restraints and haldol - 8/19 resolved - will add seroquel hs prn - recurred 8/19 pm   Hospital day # 3 Continue mobilize out of bed.  Therapy consults.  Stop aspirin and start Eliquis.  Transfer to rehab over the next few days when bed available.  Continue to use Seroquel at night for agitation and sleep.  Antony Contras, MD Medical Director [facility] Stroke [facility] Pager: [phone] 01/15/2019 12:19 PM   To contact Stroke Continuity provider, please refer to [email or web address] After hours, contact General Neurology

## 2019-01-16 MED ORDER — ASPIRIN 81 MG PO CHEW
81.0000 mg | CHEWABLE_TABLET | Freq: Every day | ORAL | Status: DC
  Administered 2019-01-17 – 2019-01-20 (×4): 81 mg via ORAL
  Filled 2019-01-16 (×4): qty 1

## 2019-01-16 NOTE — NC FL2 (Addendum)
[place] LEVEL OF CARE SCREENING TOOL     IDENTIFICATION  Patient Name: Nicholas Contreras Birthdate: Sep 03, 1928 Sex: male Admission Date (Current Location): 01/12/2019  [facility] and [place] Number:  Herbalist and Address:  [facility] [place], [place] [address], [place], [place] [address]      Provider Number: [id number]  Attending Physician Name and Address:  Garvin Fila, MD  Relative Name and Phone Number:       Current Level of Care: Hospital Recommended Level of Care: [place] Prior Approval Number:    Date Approved/Denied:   PASRR Number:  [id number]  Discharge Plan: SNF    Current Diagnoses: Patient Active Problem List   Diagnosis Date Noted  . 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 Thrombocytosis ([place]) 10/12/2014    Orientation RESPIRATION BLADDER Height & Weight     Self, Situation, Place  Normal Incontinent Weight: 79.6 kg Height:  5\' 10"  (177.8 cm)  BEHAVIORAL SYMPTOMS/MOOD NEUROLOGICAL BOWEL NUTRITION STATUS      Continent Diet(dysphagia 2, nectar thick)  AMBULATORY STATUS COMMUNICATION OF NEEDS Skin   Extensive Assist Verbally Surgical wounds(puncture sites to bilat groins)                       Personal Care Assistance Level of Assistance  Bathing, Feeding, Dressing Bathing Assistance: Maximum assistance Feeding assistance: Limited assistance Dressing Assistance: Maximum assistance     Functional Limitations Info  Sight, Hearing, Speech Sight Info: Adequate Hearing Info: Adequate Speech Info: Impaired    SPECIAL CARE FACTORS FREQUENCY  PT (By licensed PT), OT (By licensed OT), Speech therapy     PT Frequency: 5x/wk OT Frequency: 5x/wk     Speech Therapy Frequency: 5x/wk      Contractures Contractures Info: Not present    Additional Factors Info  Code Status, Allergies Code Status Info: Full Allergies Info: Codeine           Current Medications  (01/16/2019):  This is the current hospital active medication list Current Facility-Administered Medications  Medication Dose Route Frequency Provider Last Rate Last Dose  .  stroke: mapping our early stages of recovery book   Does not apply Once Burnetta Sabin L, NP      . 0.9 %  sodium chloride infusion   Intravenous Continuous Donzetta Starch, NP 50 mL/hr at 01/14/19 2145    . acetaminophen (TYLENOL) tablet 650 mg  650 mg Oral Q4H PRN Donzetta Starch, NP       Or  . acetaminophen (TYLENOL) solution 650 mg  650 mg Per Tube Q4H PRN Donzetta Starch, NP       Or  . acetaminophen (TYLENOL) suppository 650 mg  650 mg Rectal Q4H PRN Donzetta Starch, NP      . apixaban (ELIQUIS) tablet 5 mg  5 mg Oral BID Garvin Fila, MD   5 mg at 01/16/19 1007  . atenolol (TENORMIN) tablet 50 mg  50 mg Oral Daily Burnetta Sabin L, NP   50 mg at 01/16/19 1006  . enalapril (VASOTEC) tablet 20 mg  20 mg Oral BID Burnetta Sabin L, NP   20 mg at 01/16/19 1006  . hydroxyurea (HYDREA) capsule 500 mg  500 mg Oral Daily Burnetta Sabin L, NP   500 mg at 01/16/19 1007  . metoprolol tartrate (LOPRESSOR) injection 2.5 mg  2.5 mg Intravenous Once Greta Doom, MD   Stopped at  01/14/19 0100  . metoprolol tartrate (LOPRESSOR) injection 2.5 mg  2.5 mg Intravenous Q6H PRN Donzetta Starch, NP   2.5 mg at 01/15/19 0835  . pravastatin (PRAVACHOL) tablet 80 mg  80 mg Oral Daily Burnetta Sabin L, NP   80 mg at 01/16/19 1006  . QUEtiapine (SEROQUEL) tablet 12.5 mg  12.5 mg Oral QHS PRN Donzetta Starch, NP      . Resource ThickenUp Clear   Oral PRN Garvin Fila, MD      . tamsulosin [facility]) capsule 0.4 mg  0.4 mg Oral Daily Donzetta Starch, NP   0.4 mg at 01/16/19 1007     Discharge Medications: Please see discharge summary for a list of discharge medications.  Relevant Imaging Results:  Relevant Lab Results:   Additional Information SS#: [id number]  Pollie Friar, RN  I have personally obtained history,examined this  patient, reviewed notes, independently viewed imaging studies, participated in medical decision making and plan of care.ROS completed by me personally and pertinent positives fully documented  I have made any additions or clarifications directly to the above note. Agree with note above.    Antony Contras, MD Medical Director [facility] Stroke Center Pager: [phone] 01/19/2019 8:44 AM

## 2019-01-16 NOTE — Progress Notes (Signed)
Physical Therapy Treatment Patient Details Name: Nicholas Contreras MRN: [id number] DOB: 21-Sep-1928 Today's Date: 01/16/2019    History of Present Illness Patient is a 85 y/o male who presents with right sided weakness and aphasia. CTA- Left M1 occlusion s/p mechanical thrombectomy 8/17 achieving revascularization. Brain MRI-patchy infarcts left basal ganglia and subcortical left frontal lobe. PMH includes A-fib not on anticoagulation, HTN, HLD.    PT Comments    Patient with decline in mobility this session with increased lethargy and decreased participation as well as worsening balance and continued limited activity tolerance.  Feel based on limited progress prior to today and decline this session patient likely to need longer term rehab plans so recommend SNF level rehab.  PT to continue to follow actuely.    Follow Up Recommendations  SNF;Supervision/Assistance - 24 hour     Equipment Recommendations  Other (comment)(TBA at next venue)    Recommendations for Other Services       Precautions / Restrictions Precautions Precautions: Fall Precaution Comments: monitor HR    Mobility  Bed Mobility Overal bed mobility: Needs Assistance Bed Mobility: Supine to Sit;Sit to Supine     Supine to sit: HOB elevated;Max assist Sit to supine: Max assist   General bed mobility comments: heavy assist to lift trunk; assist to supine for legs and trunk  Transfers Overall transfer level: Needs assistance Equipment used: Rolling walker (2 wheeled) Transfers: Sit to/from [facility] Sit to Stand: Max assist Stand pivot transfers: Max assist       General transfer comment: heavy lifting assist from EOB and pt soiled with urine despite condom cath so returned to sitting EOB, obtained BSC and attempted SPT but pt unable to stand erect nor move his feet so returned to EOB and assisted to supine NT in to assist to scoot up in bed and for placing clean pads under  pt.  Ambulation/Gait                 Stairs             Wheelchair Mobility    Modified Rankin (Stroke Patients Only) Modified Rankin (Stroke Patients Only) Pre-Morbid Rankin Score: No significant disability Modified Rankin: Severe disability     Balance Overall balance assessment: Needs assistance Sitting-balance support: Feet supported Sitting balance-Leahy Scale: Poor Sitting balance - Comments: min to mod A for sitting balance at EOB x about 5 minutes Postural control: Posterior lean;Left lateral lean Standing balance support: Bilateral upper extremity supported Standing balance-Leahy Scale: Zero Standing balance comment: unable to maintain standing with RW and mod to max A this sesison                            Cognition Arousal/Alertness: Lethargic Behavior During Therapy: Flat affect Overall Cognitive Status: Impaired/Different from baseline Area of Impairment: Following commands;Attention                   Current Attention Level: Focused   Following Commands: Follows one step commands inconsistently Safety/Judgement: Decreased awareness of safety;Decreased awareness of deficits   Problem Solving: Slow processing        Exercises      General Comments        Pertinent Vitals/Pain Pain Assessment: No/denies pain Faces Pain Scale: No hurt    Home Living     Available Help at Discharge: Family;Available 24 hours/day(Wife has dementia per EMR) Type of Home: House  Prior Function            PT Goals (current goals can now be found in the care plan section) Progress towards PT goals: Not progressing toward goals - comment(more lethargic today)    Frequency    Min 4X/week      PT Plan Discharge plan needs to be updated    Co-evaluation              AM-PAC PT "6 Clicks" Mobility   Outcome Measure  Help needed turning from your back to your side while in a flat bed without using  bedrails?: A Lot Help needed moving from lying on your back to sitting on the side of a flat bed without using bedrails?: Total Help needed moving to and from a bed to a chair (including a wheelchair)?: Total Help needed standing up from a chair using your arms (e.g., wheelchair or bedside chair)?: Total Help needed to walk in hospital room?: Total Help needed climbing 3-5 steps with a railing? : Total 6 Click Score: 7    End of Session Equipment Utilized During Treatment: Gait belt Activity Tolerance: Patient limited by fatigue Patient left: in bed;with call bell/phone within reach;with bed alarm set   PT Visit Diagnosis: Hemiplegia and hemiparesis;Difficulty in walking, not elsewhere classified (R26.2);Muscle weakness (generalized) (M62.81);Unsteadiness on feet (R26.81) Hemiplegia - Right/Left: Right Hemiplegia - dominant/non-dominant: Dominant Hemiplegia - caused by: Cerebral infarction     Time: 1210-1240 PT Time Calculation (min) (ACUTE ONLY): 30 min  Charges:  $Therapeutic Activity: 23-37 mins                     Magda Kiel, [place] Acute [facility] Services [phone] 01/16/2019    Reginia Naas 01/16/2019, 1:25 PM

## 2019-01-16 NOTE — Progress Notes (Signed)
STROKE TEAM PROGRESS NOTE   INTERVAL HISTORY Patient passed swallow eval yesterday with speech therapy.  He remains intermittently agitated.  He has not been eating well and states he does not have much of an appetite..  Rehab team now recommends SNF for rehabilitation. Vitals:   01/16/19 0338 01/16/19 0341 01/16/19 0700 01/16/19 1100  BP: (!) 154/96  (!) 149/89 (!) 134/95  Pulse: (!) 112  (!) 101 89  Resp: 20 19  (!) 23  Temp: 98 F (36.7 C)  97.7 F (36.5 C) 97.6 F (36.4 C)  TempSrc: Axillary  Axillary Axillary  SpO2: 100%  96% 100%  Weight:      Height:        CBC:  Recent Labs  Lab 01/12/19 0747  01/13/19 0212 01/15/19 0326  WBC 8.2  --  7.7 9.7  NEUTROABS 6.6  --  5.9  --   HGB 15.1   < > 12.3* 13.8  HCT 44.4   < > 36.9* 39.8  MCV 105.7*  --  106.0* 102.3*  PLT 702*  --  604* 660*   < > = values in this interval not displayed.    Basic Metabolic Panel:  Recent Labs  Lab 01/13/19 0212 01/15/19 0326  NA 136 134*  K 3.8 3.5  CL 107 103  CO2 21* 19*  GLUCOSE 87 96  BUN 11 12  CREATININE 1.22 0.95  CALCIUM 8.5* 8.6*   Lipid Panel:     Component Value Date/Time   CHOL 145 01/13/2019 0212   TRIG 97 01/13/2019 0212   HDL 39 (L) 01/13/2019 0212   CHOLHDL 3.7 01/13/2019 0212   VLDL 19 01/13/2019 0212   LDLCALC 87 01/13/2019 0212   HgbA1c:  Lab Results  Component Value Date   HGBA1C 5.1 01/13/2019   Urine Drug Screen:     Component Value Date/Time   LABOPIA NONE DETECTED 01/12/2019 0747   COCAINSCRNUR NONE DETECTED 01/12/2019 0747   LABBENZ NONE DETECTED 01/12/2019 0747   AMPHETMU NONE DETECTED 01/12/2019 0747   THCU NONE DETECTED 01/12/2019 0747   LABBARB NONE DETECTED 01/12/2019 0747    Alcohol Level     Component Value Date/Time   ETH <10 01/12/2019 0747    IMAGING Ct Head Code Stroke Wo Contrast 01/12/2019 1. Equivocal for hyperdense left MCA. No hemorrhage or visible acute infarct.ASPECTS is 10. 2. Prominent atrophy and chronic small  vessel ischemia.   Ct Code Stroke Cta Head W/wo Contrast Ct Code Stroke Cta Neck W/wo Contrast 01/12/2019 1. Left M1 embolus/occlusion with downstream reconstitution. There is 92 cc penumbra with no core infarct by CT perfusion. 2. Limited atheromatous changes.   Ct Code Stroke Cta Cerebral Perfusion W/wo Contrast 01/12/2019 1. Left M1 embolus/occlusion with downstream reconstitution. There is 92 cc penumbra with no core infarct by CT perfusion. 2. Limited atheromatous changes.   Cerebral Angiogram S/P lt common carotid arteriogram followed bt complete revascularization of occluded Lt MCA at origin with x 1 pass with 43mm x 40 mm solitaire x  Retriever  device achieving a TICI 3 revascularization.  Mr Brain Wo Contrast 01/13/2019 1. Patchy small volume acute ischemic nonhemorrhagic infarcts involving the left basal ganglia and subcortical left frontal lobe as above. 2. Underlying age-related cerebral atrophy with moderate chronic microvascular ischemic disease.   2D Echocardiogram  1. The left ventricle has hyperdynamic systolic function, with an ejection fraction of >65%. The cavity size was normal. There is mildly increased left ventricular wall thickness. Left ventricular diastolic  Doppler parameters are indeterminate. No  evidence of left ventricular regional wall motion abnormalities.  2. The right ventricle has normal systolic function. The cavity was normal. There is no increase in right ventricular wall thickness.  3. Left atrial size was moderately dilated.  4. The pericardial effusion is localized near the right atrium.  5. Trivial pericardial effusion is present.  6. Tricuspid valve regurgitation is moderate.  7. The aortic valve is tricuspid. Mild thickening of the aortic valve. Mild calcification of the aortic valve. Aortic valve regurgitation is mild by color flow Doppler.  8. The aorta is normal unless otherwise noted.  9. No intracardiac thrombi or masses were  visualized.   PHYSICAL EXAM    Frail elderly Caucasian male not in distress.  He is hard of hearing. . Afebrile. Head is nontraumatic. Neck is supple without bruit.    Cardiac exam no murmur or gallop. Lungs are clear to auscultation. Distal pulses are well felt. Neurological Exam ; Awake alert dysarthric but can be understood.  Follows commands well.  Oriented to place and person.  Diminished attention, registration and recall.  Extraocular movements are full range without nystagmus.  Blinks to threat bilaterally.  Right lower facial weakness.  Tongue midline.  Motor system exam shows subtle right upper extremity drift with weakness of right grip and intrinsic hand muscles.  Orbits left over right upper extremity.  Symmetric lower extremity strength.  Sensation is intact bilaterally.  Deep tendon flexes are symmetric.  Finger-to-nose coordination is impaired on the right only.  Plantars are downgoing.  Gait not tested. NIH stroke scale 6   ASSESSMENT/PLAN Nicholas Contreras is a 85 y.o. male with history of thrombocythemia, hyperlipidemia, hypertension, atrial fibrillation not on anticoagulation presenting with aphasia and R sided weakness.   Stroke:   L basal ganglia infarct s/p IR occluded L MCA - embolic secondary to known AF not on [facility]  Code Stroke CT head hyperdense L MCA. No acute abnormality. Small vessel disease. Atrophy. ASPECTS 10.     CTA head & neck L M1 occlusion  CT perfusion 92cc penumbra w/ no core infarct  Cerebral angio L MCA occlusion at origin w/ TICI3 revascularization w/ 1 pass solitaire  Post IR CT No gross ICH or mass effect.  MRI  Patchy small L basal ganglia and subcoritcal L frontal lobe infarcts. Small vessel disease. Atrophy.   2D Echo EF >65%. No source of embolus. LA mod dilated.   LDL 87  HgbA1c 5.1  SCDs for VTE prophylaxis  aspirin 81 mg daily prior to admission, started on Eliquis   therapy recommendations: Skilled nursing home  Disposition:   pending  (caregiver for wife who has dementia)  Atrial Fibrillation  Home anticoagulation:  none   Last INR 1.2 . added Eliquis (apixaban) daily    Hypertension Tachycardia  Home meds:  Tenormin 50, enalapril 20 bid, HCTZ 12.5  Stable  HR up to 140s this am without home meds for several days. Elevated MEWS. No change in neuro exam.  Treated with metoprolol prn and resumed home meds. HR down to 104.  Long-term BP goal normotensive  Hyperlipidemia  Home meds:  pravachol 80  LDL 87, goal < 70  Resumed Pravachol 80   Continue statin at discharge  Dysphagia . Secondary to stroke . Cleared for D2 nectar thick liquid diet w/ MBSS  . on VF to 50/hr . Speech on board   Other Stroke Risk Factors  Advanced age  Hx smokeless tobacco  user  Hx ETOH use  Other Active Problems  BPH on flomax  Essential Thrombocythemia on Hydroxyurea and plendil PTA, PLT 604  Agitation - likely sundowning on 8/18 requiring restraints and haldol - 8/19 resolved - will add seroquel hs prn - recurred 8/19 pm   Hospital day # 4 Continue Eliquis.  Transfer to SNF for rehab over the next few days when bed available.  Continue to use Seroquel at night for agitation and sleep.  Antony Contras, MD Medical Director [place] Pager: [phone] 01/16/2019 2:04 PM   To contact Stroke Continuity provider, please refer to [email or web address] After hours, contact General Neurology

## 2019-01-16 NOTE — Progress Notes (Signed)
Inpatient Rehabilitation-Admissions Coordinator   Reviewed latest PT note from today. PT recommendation has been changed from CIR to SNF. Based on note details and pt's current lack of assist at DC, [facility] would recommend SNF placement. AC did contact pt's son [name] to discuss recommendations. He plans to visit his dad today to discuss rehab options.   AC has communicated SNF recommendation to CM/SW.   Please call if questions.   Jhonnie Garner, OTR/L  Rehab Admissions Coordinator  [phone] 01/16/2019 1:50 PM

## 2019-01-16 NOTE — Progress Notes (Signed)
  Speech Language Pathology Treatment: Cognitive-Linquistic  Patient Details Name: Nicholas Contreras MRN: [id number] DOB: 23-May-1929 Today's Date: 01/16/2019 Time: [id number] SLP Time Calculation (min) (ACUTE ONLY): 25 min  Assessment / Plan / Recommendation Clinical Impression  Pt was seen for treatment and was cooperative but exhibited increased difficulty maintaining alertness as the session progressed. Pt required mod-max support for sequencing, attention, and problem solving while using the phone. He required cues to reposition the phone after it slid to his chest and he continued speaking and to ensure that he was hearing his wife on the phone. He was educated regarding the nature of dysarthria, and compensatory strategies to improve speech intelligibility. Pt verbalized understanding regarding all areas of education but exhibited difficulty with recall and use of strategies. He used compensatory strategies at the phrase level with 20% accuracy increasing to 50% accuracy with mod-max cues for overarticulation and vocal intensity. SLP will continue to follow pt.     HPI HPI: Nicholas Contreras is a 85 y.o. male admitted with speech disturbance, aphasia and decreased mobility. MRI Patchy small volume acute ischemic nonhemorrhagic infarcts involving the left basal ganglia and subcortical left frontal lobe, Underlying age-related cerebral atrophy with moderate chronic microvascular ischemic disease.      SLP Plan  Continue with current plan of care  Patient needs continued Speech Lanaguage Pathology Services    Recommendations                   Follow up Recommendations: Inpatient Rehab;24 hour supervision/assistance SLP Visit Diagnosis: Cognitive communication deficit (R41.841);Dysarthria and anarthria (R47.1) Plan: Continue with current plan of care       Donaciano Range I. Hardin Negus, [place], [place] Office number [phone] Pager [phone]                 Horton Marshall 01/16/2019, 12:32 PM

## 2019-01-16 NOTE — Evaluation (Signed)
Speech Language Pathology Evaluation Patient Details Name: Nicholas Contreras MRN: [id number] DOB: 1929/05/10 Today's Date: 01/16/2019 Time: [id number] SLP Time Calculation (min) (ACUTE ONLY): 16 min  Problem List:  Patient Active Problem List   Diagnosis Date Noted  . 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 Thrombocytosis ([place]) 10/12/2014   Past Medical History:  Past Medical History:  Diagnosis Date  . BPH (benign prostatic hyperplasia)   . Essential thrombocythemia ([place])   . Hyperlipidemia   . Hypertension   . Osteoporosis    Past Surgical History:  Past Surgical History:  Procedure Laterality Date  . RADIOLOGY WITH ANESTHESIA N/A 01/12/2019   Procedure: IR WITH ANESTHESIA;  Surgeon: Radiologist, Medication, MD;  Location: [place];  Service: Radiology;  Laterality: N/A;  . SKIN CANCER EXCISION     was on head    HPI:  Nicholas Contreras is a 85 y.o. male admitted with speech disturbance, aphasia and decreased mobility. MRI Patchy small volume acute ischemic nonhemorrhagic infarcts involving the left basal ganglia and subcortical left frontal lobe, Underlying age-related cerebral atrophy with moderate chronic microvascular ischemic disease.   Assessment / Plan / Recommendation Clinical Impression  Pt reported that he was living with his wife prior to admission. He denied any baseline deficits in speech, language or cognition but was also inconsistent with his report of acute changes in speech/language/cognition. His reliability as a historian was therefore questioned and, with his permission, his wife was contacted via phone. Pt's wife stated that she has been managing the pt's medications and finances since the pt is "almost blind" but she also denied his having any baseline deficits in speech, language or cognition. She spoke with him briefly on the phone and indicated that his speech intelligibility is not close to baseline.   Pt presented with moderate to severe dysarthria  characterized by reduced articulatory precision and reduced vocal intensity which together negatively impacted speech intelligibility. Cognitive-linguistic deficits were also noted in the areas of memory, attention, and problem solving but language skills were functional. Skilled SLP services are clinically indicated at this time to improve motor speech and cognitive-linguistic skills. Pt, his wife, and nursing were educated regarding results and recommendations; all parties verbalized understanding as well as agreement with plan of care.    SLP Assessment  SLP Recommendation/Assessment: Patient needs continued Speech Lanaguage Pathology Services SLP Visit Diagnosis: Cognitive communication deficit (R41.841);Dysarthria and anarthria (R47.1)    Follow Up Recommendations  Inpatient Rehab;24 hour supervision/assistance    Frequency and Duration min 2x/week  2 weeks      SLP Evaluation Cognition  Overall Cognitive Status: Impaired/Different from baseline Arousal/Alertness: Awake/alert Orientation Level: Oriented to person;Oriented to place;Oriented to situation(Oriented to month, and year not date or day) Attention: Focused;Sustained Focused Attention: Appears intact Sustained Attention: Impaired Sustained Attention Impairment: Verbal complex Memory: Impaired Memory Impairment: Storage deficit;Retrieval deficit;Decreased recall of new information(Immediate: 3/3; delayed: 0/3; with cues: 2/3) Problem Solving: Impaired Problem Solving Impairment: Verbal complex Executive Function: Reasoning Reasoning: Appears intact(Abstraction: 2/2)       Comprehension  Auditory Comprehension Overall Auditory Comprehension: Appears within functional limits for tasks assessed Yes/No Questions: Within Functional Limits Basic Biographical Questions: (5/5) Complex Questions: (5/5) Paragraph Comprehension (via yes/no questions): (2/4) Commands: Within Functional Limits Two Step Basic Commands:  (4/4) Multistep Basic Commands: (3/3) Conversation: Complex Reading Comprehension Reading Status: Unable to assess (comment)(Due to vision impairment)    Expression Expression Primary Mode of Expression: Verbal Verbal Expression Overall Verbal Expression: Appears within functional limits  for tasks assessed Initiation: No impairment Automatic Speech: Counting;Day of week;Month of year(WNL) Level of Generative/Spontaneous Verbalization: Conversation Repetition: No impairment Pragmatics: No impairment   Oral / Surveyor, quantity Overall Motor Speech: Impaired Respiration: Impaired Level of Impairment: Sentence Phonation: Hoarse;Low vocal intensity Resonance: Within functional limits Articulation: Impaired Level of Impairment: Conversation Intelligibility: Intelligibility reduced Word: 75-100% accurate Phrase: 75-100% accurate Sentence: 50-74% accurate Conversation: 25-49% accurate Motor Planning: Witnin functional limits Motor Speech Errors: Aware;Consistent   Nicholas Contreras, [place], [place] Office number [phone] Pager [phone]                   Horton Marshall 01/16/2019, 12:25 PM

## 2019-01-16 NOTE — TOC Initial Note (Signed)
Transition of Care [facility]) - Initial/Assessment Note    Patient Details  Name: Nicholas Contreras MRN: [id number] Date of Birth: 09/16/1928  [facility]) CM/SW Contact:    Pollie Friar, RN Phone Number: 01/16/2019, 4:26 PM  Clinical Narrative:                 Pt is not going to have the needed support after CIR and PT has changed their recommendations to SNF rehab. CM spoke to patients son: Juliane Lack and he is in agreement for SNF with pt being faxed out in [place] completed and pt faxed out. Son is most interested in [facility] and [facility].  TOC following.  Expected Discharge Plan: Skilled Nursing Facility Barriers to Discharge: Continued Medical Work up   Patient Goals and CMS Choice   CMS Medicare.gov Compare Post Acute Care list provided to:: Patient Represenative (must comment) Choice offered to / list presented to : Adult Children(son)  Expected Discharge Plan and Services Expected Discharge Plan: Skilled Nursing Facility In-house Referral: Clinical Social Work Discharge Planning Services: CM Consult Post Acute Care Choice: [place]                                        Prior Living Arrangements/Services   Lives with:: Spouse          Need for Family Participation in Patient Care: Yes (Comment) Care giver support system in place?: No (comment)   Criminal Activity/Legal Involvement Pertinent to Current Situation/Hospitalization: No - Comment as needed  Activities of Daily Living Home Assistive Devices/Equipment: None ADL Screening (condition at time of admission) Patient's cognitive ability adequate to safely complete daily activities?: Yes Is the patient deaf or have difficulty hearing?: No Does the patient have difficulty seeing, even when wearing glasses/contacts?: Yes Does the patient have difficulty concentrating, remembering, or making decisions?: No Patient able to express need for assistance with ADLs?:  Yes Does the patient have difficulty dressing or bathing?: No Independently performs ADLs?: Yes (appropriate for developmental age) Does the patient have difficulty walking or climbing stairs?: No Weakness of Legs: Both Weakness of Arms/Hands: None  Permission Sought/Granted                  Emotional Assessment              Admission diagnosis:  Stroke (cerebrum) ([place]) [I63.9] Acute ischemic stroke ([place]) [I63.9] Cerebrovascular accident (CVA), unspecified mechanism ([place]) [I63.9] Patient Active Problem List   Diagnosis Date Noted  . 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 Thrombocytosis ([place]) 10/12/2014   PCP:  Glendale Chard, MD Pharmacy:   [id number] - [place], [place] - [address] [place] [address] [place] [address] Phone: [phone] Fax: [phone]  [facility] [place] [place] [address] Phone: [phone] Fax: [phone]     Social Determinants of Health (SDOH) Interventions    Readmission Risk Interventions No flowsheet data found.

## 2019-01-17 DIAGNOSIS — E785 Hyperlipidemia, unspecified: Secondary | ICD-10-CM

## 2019-01-17 DIAGNOSIS — D473 Essential (hemorrhagic) thrombocythemia: Secondary | ICD-10-CM

## 2019-01-17 DIAGNOSIS — I4891 Unspecified atrial fibrillation: Secondary | ICD-10-CM

## 2019-01-17 DIAGNOSIS — I10 Essential (primary) hypertension: Secondary | ICD-10-CM

## 2019-01-17 DIAGNOSIS — I63412 Cerebral infarction due to embolism of left middle cerebral artery: Secondary | ICD-10-CM

## 2019-01-17 NOTE — Progress Notes (Signed)
STROKE TEAM PROGRESS NOTE   INTERVAL HISTORY Patient lying in bed, not orientated to place and year but orientated to month. Still has moderate dysarthria. Urine amber color, IV off this am due to IV access, will resume. Pending SNF for rehabilitation.   Vitals:   01/16/19 2311 01/17/19 0319 01/17/19 0723 01/17/19 1122  BP: (!) 137/92 (!) 143/97 (!) 129/106   Pulse: 90 84    Resp: 18 18 18    Temp: 98.4 F (36.9 C) 98.2 F (36.8 C) 98.2 F (36.8 C) 98.2 F (36.8 C)  TempSrc: Oral Oral Axillary Oral  SpO2: 95% 100% 100%   Weight:      Height:        CBC:  Recent Labs  Lab 01/12/19 0747  01/13/19 0212 01/15/19 0326  WBC 8.2  --  7.7 9.7  NEUTROABS 6.6  --  5.9  --   HGB 15.1   < > 12.3* 13.8  HCT 44.4   < > 36.9* 39.8  MCV 105.7*  --  106.0* 102.3*  PLT 702*  --  604* 660*   < > = values in this interval not displayed.    Basic Metabolic Panel:  Recent Labs  Lab 01/13/19 0212 01/15/19 0326  NA 136 134*  K 3.8 3.5  CL 107 103  CO2 21* 19*  GLUCOSE 87 96  BUN 11 12  CREATININE 1.22 0.95  CALCIUM 8.5* 8.6*   Lipid Panel:     Component Value Date/Time   CHOL 145 01/13/2019 0212   TRIG 97 01/13/2019 0212   HDL 39 (L) 01/13/2019 0212   CHOLHDL 3.7 01/13/2019 0212   VLDL 19 01/13/2019 0212   LDLCALC 87 01/13/2019 0212   HgbA1c:  Lab Results  Component Value Date   HGBA1C 5.1 01/13/2019   Urine Drug Screen:     Component Value Date/Time   LABOPIA NONE DETECTED 01/12/2019 0747   COCAINSCRNUR NONE DETECTED 01/12/2019 0747   LABBENZ NONE DETECTED 01/12/2019 0747   AMPHETMU NONE DETECTED 01/12/2019 0747   THCU NONE DETECTED 01/12/2019 0747   LABBARB NONE DETECTED 01/12/2019 0747    Alcohol Level     Component Value Date/Time   ETH <10 01/12/2019 0747    IMAGING  Ct Head Code Stroke Wo Contrast 01/12/2019 1. Equivocal for hyperdense left MCA. No hemorrhage or visible acute infarct.ASPECTS is 10. 2. Prominent atrophy and chronic small vessel  ischemia.   Ct Code Stroke Cta Head W/wo Contrast Ct Code Stroke Cta Neck W/wo Contrast 01/12/2019 1. Left M1 embolus/occlusion with downstream reconstitution. There is 92 cc penumbra with no core infarct by CT perfusion. 2. Limited atheromatous changes.   Ct Code Stroke Cta Cerebral Perfusion W/wo Contrast 01/12/2019 1. Left M1 embolus/occlusion with downstream reconstitution. There is 92 cc penumbra with no core infarct by CT perfusion. 2. Limited atheromatous changes.   Cerebral Angiogram S/P lt common carotid arteriogram followed bt complete revascularization of occluded Lt MCA at origin with x 1 pass with 46mm x 40 mm solitaire x  Retriever  device achieving a TICI 3 revascularization.  Mr Brain Wo Contrast 01/13/2019 1. Patchy small volume acute ischemic nonhemorrhagic infarcts involving the left basal ganglia and subcortical left frontal lobe as above. 2. Underlying age-related cerebral atrophy with moderate chronic microvascular ischemic disease.   2D Echocardiogram  1. The left ventricle has hyperdynamic systolic function, with an ejection fraction of >65%. The cavity size was normal. There is mildly increased left ventricular wall thickness. Left ventricular diastolic  Doppler parameters are indeterminate. No  evidence of left ventricular regional wall motion abnormalities.  2. The right ventricle has normal systolic function. The cavity was normal. There is no increase in right ventricular wall thickness.  3. Left atrial size was moderately dilated.  4. The pericardial effusion is localized near the right atrium.  5. Trivial pericardial effusion is present.  6. Tricuspid valve regurgitation is moderate.  7. The aortic valve is tricuspid. Mild thickening of the aortic valve. Mild calcification of the aortic valve. Aortic valve regurgitation is mild by color flow Doppler.  8. The aorta is normal unless otherwise noted.  9. No intracardiac thrombi or masses were  visualized.   PHYSICAL EXAM    Frail elderly Caucasian male not in distress.  He is hard of hearing.  Afebrile. Head is nontraumatic. Neck is supple without bruit.  Cardiac exam no murmur or gallop, however, irregularly irregular heart rate and rhythm. Lungs are clear to auscultation. Distal pulses are well felt. Neurological Exam ; Awake alert moderate dysarthric but can be understood.  Follows commands well.  Oriented to month and person, however not orientated to place and the year.  Diminished attention, registration and recall.  Extraocular movements are full range without nystagmus.  Blinks to threat bilaterally.  Right lower facial weakness.  Tongue midline.  Motor system exam shows subtle right upper extremity drift with weakness of right grip and intrinsic hand muscles. Symmetric lower extremity strength.  Sensation is intact bilaterally.  Deep tendon flexes are symmetric.  Finger-to-nose coordination is impaired on the right only.  Plantars are downgoing.  Gait not tested.   ASSESSMENT/PLAN [name] is a 85 y.o. male with history of thrombocythemia, hyperlipidemia, hypertension, atrial fibrillation not on anticoagulation presenting with aphasia and R sided weakness.   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 No acute abnormality.     CTA head & neck L M1 occlusion  CT perfusion 92cc penumbra w/ no core infarct  Cerebral angio L MCA occlusion at origin w/ TICI3 revascularization  MRI  Patchy small L basal ganglia and subcoritcal L frontal lobe infarcts.   2D Echo EF >65%. No source of embolus. LA mod dilated.   LDL 87  HgbA1c 5.1  Eliquis for VTE prophylaxis  aspirin 81 mg daily prior to admission, now on Eliquis.  Continue Eliquis on discharge for stroke prevention.  therapy recommendations: SNF  Disposition:  pending  (caregiver for wife who  has dementia)  Atrial Fibrillation  Home anticoagulation:  none   Likely the cause of current stroke . added Eliquis for stroke prevention . Continue Eliquis on discharge . A. fib RVR resolved . On atenolol   Essential thrombocythemia  On hydroxyurea PTA  Jak 2 mutation positive  Platelet still high 604->660  Add aspirin 81  Continue on discharge  Close follow-up with hematology Dr. Alen Blew.  Hypertension  Home meds: Atenolol 50, enalapril 20 bid, HCTZ 12.5  Stable  Resumed atenolol home meds.   Long-term BP goal normotensive  Hyperlipidemia  Home meds:  pravachol 80  LDL 87, goal < 70  Resumed Pravachol 80  Continue statin at discharge  Dysphagia . Secondary to stroke . Cleared for D2 nectar thick liquid diet w/ MBSS  . on VF to 50/hr . Speech on board   Other Stroke Risk Factors  Advanced  age  Hx smokeless tobacco user  Hx ETOH use  Other Active Problems  BPH on flomax  Agitation - likely sundowning on 8/18 requiring restraints and haldol - 8/19 resolved -on Seroquel hs prn    Hospital day # 5  Rosalin Hawking, MD PhD Stroke Neurology 01/17/2019 2:52 PM   To contact Stroke Continuity provider, please refer to [email or web address] After hours, contact General Neurology

## 2019-01-17 NOTE — Discharge Summary (Addendum)
Patient ID: Nicholas Contreras   MRN: [id number]      DOB: 1928/08/25  Date of Admission: 01/12/2019 Date of Discharge: 01/26/2019  Attending Physician:  Nicholas Fila, MD, Stroke MD Consultant(s):   Wound Care RN ;   Interventional Radiology - Dr Nicholas Contreras, Nicholas Oh MD (nephrology) Patient's PCP:  Nicholas Chard, MD  DISCHARGE DIAGNOSIS:  Principal Problem:   Middle cerebral artery embolism, left - s/p IR, embolic d/t AF not on [facility] Active Problems:   Essential thrombocythemia (Nicholas Contreras)   Atrial fibrillation with RVR (Nicholas Contreras)   Agitation   Dysphagia   Hyperlipidemia   Long term current use of anticoagulant therapy   Hematuria   AKI (acute kidney injury) (Nicholas Contreras)   Metabolic acidosis   BPH (benign prostatic hyperplasia)   Hypokalemia   Past Medical History:  Diagnosis Date  . BPH (benign prostatic hyperplasia)   . Essential thrombocythemia (Nicholas Contreras)   . Hyperlipidemia   . Hypertension   . Osteoporosis    Past Surgical History:  Procedure Laterality Date  . IR CT HEAD LTD  01/12/2019  . IR PERCUTANEOUS ART THROMBECTOMY/INFUSION INTRACRANIAL INC DIAG ANGIO  01/12/2019  . RADIOLOGY WITH ANESTHESIA N/A 01/12/2019   Procedure: IR WITH ANESTHESIA;  Surgeon: Radiologist, Medication, MD;  Location: [facility];  Service: Radiology;  Laterality: N/A;  . SKIN CANCER EXCISION     was on head     Family History History reviewed. No pertinent family history.  Social History  reports that he has never smoked. He has quit using smokeless tobacco. He reports previous alcohol use. He reports that he does not use drugs.  Allergies as of 01/26/2019      Reactions   Codeine    Per son, severity unknown      Medication List    STOP taking these medications   aspirin 81 MG tablet   enalapril 20 MG tablet Commonly known as: VASOTEC   felodipine 10 MG 24 hr tablet Commonly known as: PLENDIL   hydrochlorothiazide 12.5 MG tablet Commonly known as: HYDRODIURIL   NON FORMULARY     TAKE these  medications   apixaban 5 MG Tabs tablet Commonly known as: ELIQUIS Take 1 tablet (5 mg total) by mouth 2 (two) times daily.   atenolol 50 MG tablet Commonly known as: TENORMIN Take 1 tablet (50 mg total) by mouth daily. Start taking on: January 27, 2019   ergocalciferol 1.25 MG (50000 UT) capsule Commonly known as: VITAMIN D2 Take 50,000 Units by mouth once a week.   hydrocerin Crea Apply 1 application topically 2 (two) times daily as needed (irritation).   hydroxyurea 500 MG capsule Commonly known as: HYDREA Take 1 capsule (500 mg total) by mouth daily. May take with food to minimize GI side effects. Start taking on: January 27, 2019 What changed: See the new instructions.   metoprolol tartrate 5 MG/5ML Soln injection Commonly known as: LOPRESSOR Inject 2.5 mLs (2.5 mg total) into the vein every 6 (six) hours as needed (Tachycardia).   pravastatin 80 MG tablet Commonly known as: PRAVACHOL TAKE 1 TABLET DAILY   QUEtiapine 25 MG tablet Commonly known as: SEROQUEL Take 0.5 tablets (12.5 mg total) by mouth at bedtime as needed (agitation).   Resource ThickenUp Clear Powd Take 120 g by mouth as needed (nectar thick liquids).   tamsulosin 0.4 MG Caps capsule Commonly known as: FLOMAX Take 0.4 mg by mouth daily.       HOME MEDICATIONS PRIOR TO  ADMISSION Medications Prior to Admission  Medication Sig Dispense Refill  . aspirin 81 MG tablet Take 81 mg by mouth daily.    Nicholas Contreras atenolol (TENORMIN) 50 MG tablet Take 50 mg by mouth daily.    . enalapril (VASOTEC) 20 MG tablet TAKE 1 TABLET TWICE A DAY (Patient taking differently: Take 20 mg by mouth 2 (two) times daily. ) 180 tablet 4  . ergocalciferol (VITAMIN D2) 50000 UNITS capsule Take 50,000 Units by mouth once a week.    . felodipine (PLENDIL) 10 MG 24 hr tablet TAKE 1 TABLET DAILY (Patient taking differently: Take 10 mg by mouth daily. ) 90 tablet 0  . hydroxyurea (HYDREA) 500 MG capsule TAKE 1 CAPSULE DAILY WITH FOOD TO  MINIMIZE GASTROINTESTINAL SIDE EFFECTS (Patient taking differently: Take 500 mg by mouth daily. TAKE 1 CAPSULE DAILY WITH FOOD TO MINIMIZE GASTROINTESTINAL SIDE EFFECTS) 90 capsule 4  . NON FORMULARY Take 1 tablet by mouth daily. Super Beta Prostate formula    . pravastatin (PRAVACHOL) 80 MG tablet TAKE 1 TABLET DAILY (Patient taking differently: Take 80 mg by mouth daily. ) 90 tablet 0  . Tamsulosin HCl (FLOMAX) 0.4 MG CAPS Take 0.4 mg by mouth daily.    . hydrochlorothiazide (HYDRODIURIL) 12.5 MG tablet Take 12.5 mg by mouth daily.       HOSPITAL MEDICATIONS .  stroke: mapping our early stages of recovery book   Does not apply Once  . apixaban  5 mg Oral BID  . atenolol  50 mg Oral Daily  . hydroxyurea  500 mg Oral Daily  . pravastatin  80 mg Oral Daily  . tamsulosin  0.4 mg Oral Daily    LABORATORY STUDIES CBC    Component Value Date/Time   WBC 7.3 01/26/2019 0410   RBC 3.09 (L) 01/26/2019 0410   HGB 10.6 (L) 01/26/2019 0410   HGB 12.9 (L) 01/15/2017 0850   HCT 32.8 (L) 01/26/2019 0410   HCT 38.3 (L) 01/15/2017 0850   PLT 959 (HH) 01/26/2019 0410   PLT 391 01/15/2017 0850   MCV 106.1 (H) 01/26/2019 0410   MCV 100.8 (H) 01/15/2017 0850   MCH 34.3 (H) 01/26/2019 0410   MCHC 32.3 01/26/2019 0410   RDW 13.2 01/26/2019 0410   RDW 13.7 01/15/2017 0850   LYMPHSABS 1.0 01/13/2019 0212   LYMPHSABS 1.0 01/15/2017 0850   MONOABS 0.7 01/13/2019 0212   MONOABS 0.5 01/15/2017 0850   EOSABS 0.1 01/13/2019 0212   EOSABS 0.2 01/15/2017 0850   BASOSABS 0.0 01/13/2019 0212   BASOSABS 0.0 01/15/2017 0850   CMP    Component Value Date/Time   NA 138 01/26/2019 0410   NA 135 (L) 01/15/2017 0850   K 3.2 (L) 01/26/2019 0410   K 3.8 01/15/2017 0850   CL 104 01/26/2019 0410   CO2 26 01/26/2019 0410   CO2 23 01/15/2017 0850   GLUCOSE 108 (H) 01/26/2019 0410   GLUCOSE 99 01/15/2017 0850   BUN 24 (H) 01/26/2019 0410   BUN 11.4 01/15/2017 0850   CREATININE 1.24 01/26/2019 0410    CREATININE 1.2 01/15/2017 0850   CALCIUM 8.4 (L) 01/26/2019 0410   CALCIUM 9.1 01/15/2017 0850   PROT 7.5 01/12/2019 0747   PROT 6.8 01/15/2017 0850   ALBUMIN 4.2 01/12/2019 0747   ALBUMIN 3.7 01/15/2017 0850   AST 25 01/12/2019 0747   AST 22 01/15/2017 0850   ALT 20 01/12/2019 0747   ALT 15 01/15/2017 0850   ALKPHOS 60 01/12/2019 0747  ALKPHOS 48 01/15/2017 0850   BILITOT 1.5 (H) 01/12/2019 0747   BILITOT 0.89 01/15/2017 0850   GFRNONAA 52 (L) 01/26/2019 0410   GFRAA 60 (L) 01/26/2019 0410   COAGS Lab Results  Component Value Date   INR 1.2 01/12/2019   Lipid Panel    Component Value Date/Time   CHOL 145 01/13/2019 0212   TRIG 97 01/13/2019 0212   HDL 39 (L) 01/13/2019 0212   CHOLHDL 3.7 01/13/2019 0212   VLDL 19 01/13/2019 0212   LDLCALC 87 01/13/2019 0212   HgbA1C  Lab Results  Component Value Date   HGBA1C 5.1 01/13/2019   Urinalysis    Component Value Date/Time   COLORURINE AMBER (A) 01/23/2019 1800   APPEARANCEUR HAZY (A) 01/23/2019 1800   LABSPEC 1.015 01/23/2019 1800   PHURINE 5.0 01/23/2019 1800   GLUCOSEU NEGATIVE 01/23/2019 1800   HGBUR LARGE (A) 01/23/2019 1800   BILIRUBINUR NEGATIVE 01/23/2019 1800   KETONESUR NEGATIVE 01/23/2019 1800   PROTEINUR 30 (A) 01/23/2019 1800   NITRITE NEGATIVE 01/23/2019 1800   LEUKOCYTESUR NEGATIVE 01/23/2019 1800   Urine Drug Screen     Component Value Date/Time   LABOPIA NONE DETECTED 01/12/2019 0747   COCAINSCRNUR NONE DETECTED 01/12/2019 0747   LABBENZ NONE DETECTED 01/12/2019 0747   AMPHETMU NONE DETECTED 01/12/2019 0747   THCU NONE DETECTED 01/12/2019 0747   LABBARB NONE DETECTED 01/12/2019 0747    Alcohol Level    Component Value Date/Time   ETH <10 01/12/2019 0747     SIGNIFICANT DIAGNOSTIC STUDIES Ct Head Code Stroke Wo Contrast 01/12/2019 1. Equivocal for hyperdense left MCA. No hemorrhage or visible acute infarct. ASPECTS is 10.  2. Prominent atrophy and chronic small vessel ischemia.    Ct Code Stroke Cta Head W/wo Contrast Ct Code Stroke Cta Neck W/wo Contrast 01/12/2019 1. Left M1 embolus/occlusion with downstream reconstitution. There is 92 cc penumbra with no core infarct by CT perfusion.  2. Limited atheromatous changes.   Ct Code Stroke Cta Cerebral Perfusion W/wo Contrast 01/12/2019 1. Left M1 embolus/occlusion with downstream reconstitution. There is 92 cc penumbra with no core infarct by CT perfusion.  2. Limited atheromatous changes.   Cerebral Angiogram S/P lt common carotid arteriogram followed bt complete revascularization of occluded Lt MCA at origin with x 1 pass with 31mm x 40 mm solitaire x Retriever device achieving a TICI 3 revascularization.  Mr Brain Wo Contrast 01/13/2019 1. Patchy small volume acute ischemic nonhemorrhagic infarcts involving the left basal ganglia and subcortical left frontal lobe as above. 2. Underlying age-related cerebral atrophy with moderate chronic microvascular ischemic disease.   2D Echocardiogram 1. The left ventricle has hyperdynamic systolic function, with an ejection fraction of >65%. The cavity size was normal. There is mildly increased left ventricular wall thickness. Left ventricular diastolic Doppler parameters are indeterminate. No  evidence of left ventricular regional wall motion abnormalities. 2. The right ventricle has normal systolic function. The cavity was normal. There is no increase in right ventricular wall thickness. 3. Left atrial size was moderately dilated. 4. The pericardial effusion is localized near the right atrium. 5. Trivial pericardial effusion is present. 6. Tricuspid valve regurgitation is moderate. 7. The aortic valve is tricuspid. Mild thickening of the aortic valve. Mild calcification of the aortic valve. Aortic valve regurgitation is mild by color flow Doppler. 8. The aorta is normal unless otherwise noted. 9. No intracardiac thrombi or masses were visualized.     Renal  US 1. Mild bilateral hydronephrosis and proximal  hydroureter which may be secondary to bladder distension. The patient was unable to void during the examination. 2. Right renal cysts.  HISTORY OF PRESENT ILLNESS Nicholas Contreras is a 85 y.o. male with past medical history significant for essential thrombocythemia, hyperlipidemia, hypertension, atrial fibrillation not on anticoagulation presents to ED brought by EMS for garbled speech and right-sided weakness. Last known normal was 9 PM last night 01/11/2019 when he went to bed. His wife found the patient with a garbled speech and unable to get out of bed.  Patient's wife alerted neighbors and EMS was informed.  On arrival, patient was aphasic.  He had right sided plegia and hemineglect.  Stat CT head was performed which showed no acute hemorrhage.  CT angiogram demonstrated a left M1 occlusion and CT perfusion showed a mismatch of 92 ml.  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  He has atrial fibrillation but not on anticoagulation. He was not a tPA candidate as he was outside window. NIHSS: 16. Baseline MRS 0-1. He was take to IR for mechanical thrombectomy.   HOSPITAL COURSE Mr. RANSOM MCQUAIDE is a 85 y.o. male with history of thrombocythemia, hyperlipidemia, hypertension, atrial fibrillation not on anticoagulation presenting with aphasia and R sided weakness.   Stroke: L basal ganglia infarct s/p mechamical thrombectomy of occluded L MCA - embolic secondary to known AF not on [facility]  Code Stroke CT head hyperdense L MCA. No acute abnormality. Small vessel disease. Atrophy. ASPECTS 10.     CTA head & neck L M1 occlusion  CT perfusion 92cc penumbra w/ no core infarct  Cerebral angio L MCA occlusion at origin w/ TICI3 revascularization w/ 1 pass solitaire  Post IR CT No gross ICH or mass effect.  MRI  Patchy small L basal ganglia and subcoritcal L frontal lobe infarcts. Small vessel disease. Atrophy.   2D Echo EF >65%.  No source of embolus. LA mod dilated.   LDL 87  HgbA1c 5.1  Eliquis for VTE prophylaxis  aspirin 81 mg daily prior to admission, on eliquis now. Given Cre now 1.24, which is < 1.5, will  increase eliquis to 5mg  bid. Adjust eliquis dose down to 2.5 bid if Cre goes > 1.5    therapy recommendations: SNF  Disposition:  SNF (caregiver for wife who has dementia).    Repeat COVID test for d/c - neg   Microscopic hematuria on Eliquis  UA w/ 21-50 RBC 8/12  Repeat UA  > 50 RBC, many bact, 30 prot -> unchanged  UCx 01/20/19 >100k ecoli   UA repeat -> unchanged  On Rocephin 8/26 -> 8/29  However, no gross hematuria  Possible Anemia , resolved  Stable Anemia  Likely lab error 8/26   UA showed RBC > 50 but no gross hematuria - repeat unchanged  Given Cre now 1.24, which is < 1.5, will  increase eliquis to 5mg  bid. Adjust eliquis dose down to 2.5 bid if Cre goes > 1.5    MCV 105, B12 1,834 - do not give replacement  Atrial Fibrillation w/ RVR  Home anticoagulation:  none   Likely the cause of her current stroke  On atenolol  Given Cre now 1.24, which is < 1.5, will  increase eliquis to 5mg  bid. Adjust eliquis dose down to 2.5 bid if Cre goes > 1.5    Essential thrombocythemia  On hydroxyurea PTA  Jak 2 mutation positive  Platelet still quite high 604->660->798->905->1055->1542->1174->1181->1106, likely reactive  Discontinue  aspirin 81   Discussed with Dr. Alen Blew who felt it is reactive, no new intervention necessary  AKI d/t IV contrast and ACE inhibitor Metabolic Acidoses, improved BPH Bladder Distention   Nephrology consulted  Peak Cre 1.82->1.524 day of d/c  Renal US mild B hydronephrosis and prox hydroureter. R renal cysts.  ->Due to bladder distention given pt hx of BPH  UTI treated with IV Rocephin  Metabolic acidosis resolved stopped Iv bicarb  Continue flomax  D/c foley and follow-up bladder scan to ensure he is not having urinary  retention  Follow up Kidney US as OP to make sure hydronephrosis is resolved  OP urology f/u   Encourage po intake  Hypernatremia, resolved  Na 144->146->151->145->143->138  Encourage po intake.   Urine Na - 16  Hypertension  Home meds:  Tenormin 50, enalapril 20 bid, HCTZ 12.5  Stable  Resumed atenolol, hold off enalapril and HCTZ due to elevated Cre - may resume ACE when renal function stble  Long-term BP goal normotensive  Hyperlipidemia  Home meds:  pravachol 80  LDL 87, goal < 70  Resumed Pravachol 80 (consider changing to Lipitor)  Continue statin at discharge  Dysphagia  Secondary to stroke  Cleared for D2 nectar thick liquid diet w/ MBSS   F/u Speech    Other Stroke Risk Factors  Advanced age  Hx smokeless tobacco user  Hx ETOH use  Other Active Problems  BPH on flomax  Agitation - likely sundowning on 8/18 requiring restraints and haldol - 8/19 resolved - hs prn seroquel added- recurred 8/19 pm   Hypokalemia 3.2 - supplement with 3 runs of K - recheckin 2-3 days  DISCHARGE EXAM Frail elderly Caucasian male not in distress.  He is hard of hearing.  Afebrile. Head is nontraumatic. Neck is supple without bruit.  Cardiac exam no murmur or gallop, however, irregularly irregular heart rate and rhythm. Lungs are clear to auscultation. Distal pulses are well felt. Neurological Exam ; Awake alert moderate to severe dysarthric but hardly to be understood.  Follows commands well.  Oriented to place and year and person, however not orientated to month.  Diminished attention, registration and recall.  Extraocular movements are full range without nystagmus.  Blinks to threat bilaterally.  Right lower facial weakness.  Tongue midline.  Symmetric upper and lower extremity strength.  Sensation is intact bilaterally.  Deep tendon flexes are symmetric.  Finger-to-nose intact bilaterally but slow action.  Plantars are downgoing.  Gait not tested.  Discharge  Diet   Dysphagia 2 nectar thick liquids    DISCHARGE PLAN  Disposition:  Skilled nursing facility for ongoing PT, OT and ST.   Eliquis (apixaban) daily for secondary stroke prevention. Given Cre now 1.24, which is < 1.5, will  increase eliquis to 5mg  bid. Adjust eliquis dose down to 2.5 bid if Cre goes > 1.5    Follow BMET in 2 3- days  Encourage POs  Follow up BP-  may resume ACE when renal function stble  D/c foley and follow-up bladder scan to ensure he is not having urinary retention  Follow up Kidney US as OP to make sure hydronephrosis is resolved  Make an appt for OP urology f/u  Ongoing risk factor control by Primary Care Physician at time of discharge  Follow-up Nicholas Chard, MD in 2 weeks.  Follow-up in [facility] in 4 weeks, office to schedule an appointment.   60 minutes were spent preparing discharge.  Burnetta Sabin, MSN, APRN,  ANVP-BC, AGPCNP-BC Advanced Practice Stroke Nurse [place] for Schedule & Pager information 01/26/2019 3:21 PM  I have personally obtained history,examined this patient, reviewed notes, independently viewed imaging studies, participated in medical decision making and plan of care.ROS completed by me personally and pertinent positives fully documented  I have made any additions or clarifications directly to the above note. Agree with note above.   Antony Contras, MD Medical Director [facility] Pager: [phone] 01/26/2019 3:51 PM

## 2019-01-18 ENCOUNTER — Inpatient Hospital Stay (HOSPITAL_COMMUNITY): Payer: Medicare Other

## 2019-01-18 DIAGNOSIS — N179 Acute kidney failure, unspecified: Secondary | ICD-10-CM

## 2019-01-18 DIAGNOSIS — R1312 Dysphagia, oropharyngeal phase: Secondary | ICD-10-CM

## 2019-01-18 DIAGNOSIS — I4821 Permanent atrial fibrillation: Secondary | ICD-10-CM

## 2019-01-18 LAB — BASIC METABOLIC PANEL
Anion gap: 11 (ref 5–15)
BUN: 57 mg/dL — ABNORMAL HIGH (ref 8–23)
CO2: 21 mmol/L — ABNORMAL LOW (ref 22–32)
Calcium: 8.9 mg/dL (ref 8.9–10.3)
Chloride: 108 mmol/L (ref 98–111)
Creatinine, Ser: 2.22 mg/dL — ABNORMAL HIGH (ref 0.61–1.24)
GFR calc Af Amer: 30 mL/min — ABNORMAL LOW (ref >60)
GFR calc non Af Amer: 26 mL/min — ABNORMAL LOW (ref >60)
Glucose, Bld: 123 mg/dL — ABNORMAL HIGH (ref 70–99)
Potassium: 3.2 mmol/L — ABNORMAL LOW (ref 3.5–5.1)
Sodium: 140 mmol/L (ref 135–145)

## 2019-01-18 LAB — CBC
HCT: 39.4 % (ref 39.0–52.0)
Hemoglobin: 13.2 g/dL (ref 13.0–17.0)
MCH: 34.9 pg — ABNORMAL HIGH (ref 26.0–34.0)
MCHC: 33.5 g/dL (ref 30.0–36.0)
MCV: 104.2 fL — ABNORMAL HIGH (ref 80.0–100.0)
Platelets: 798 10*3/uL — ABNORMAL HIGH (ref 150–400)
RBC: 3.78 MIL/uL — ABNORMAL LOW (ref 4.22–5.81)
RDW: 13 % (ref 11.5–15.5)
WBC: 14.3 10*3/uL — ABNORMAL HIGH (ref 4.0–10.5)
nRBC: 0 % (ref 0.0–0.2)

## 2019-01-18 LAB — URINALYSIS, COMPLETE (UACMP) WITH MICROSCOPIC
Bilirubin Urine: NEGATIVE
Glucose, UA: NEGATIVE mg/dL
Ketones, ur: NEGATIVE mg/dL
Nitrite: NEGATIVE
Protein, ur: 30 mg/dL — AB
Specific Gravity, Urine: 1.019 (ref 1.005–1.030)
pH: 5 (ref 5.0–8.0)

## 2019-01-18 MED ORDER — HYDROCERIN EX CREA
1.0000 "application " | TOPICAL_CREAM | Freq: Two times a day (BID) | CUTANEOUS | Status: DC | PRN
  Administered 2019-01-18: 1 via TOPICAL
  Filled 2019-01-18: qty 113

## 2019-01-18 MED ORDER — BISACODYL 5 MG PO TBEC
5.0000 mg | DELAYED_RELEASE_TABLET | Freq: Every day | ORAL | Status: DC | PRN
  Administered 2019-01-18: 10 mg via ORAL
  Filled 2019-01-18: qty 2

## 2019-01-18 MED ORDER — APIXABAN 2.5 MG PO TABS
2.5000 mg | ORAL_TABLET | Freq: Two times a day (BID) | ORAL | Status: DC
  Administered 2019-01-18 – 2019-01-19 (×3): 2.5 mg via ORAL
  Filled 2019-01-18 (×3): qty 1

## 2019-01-18 MED ORDER — WHITE PETROLATUM EX OINT
TOPICAL_OINTMENT | CUTANEOUS | Status: AC
  Administered 2019-01-18: 0.2
  Filled 2019-01-18: qty 28.35

## 2019-01-18 NOTE — Progress Notes (Signed)
STROKE TEAM PROGRESS NOTE   INTERVAL HISTORY Patient lying in bed, awake alert and interactive, but not orientated to time. Still has mild to moderate dysarthria. On IVF but lab showed Cre elevated to 2.22 from 0.95 and leukocytosis. Pending SNF for rehabilitation.   Vitals:   01/17/19 1500 01/17/19 1918 01/17/19 2304 01/18/19 0305  BP: 106/64 (!) 121/91 (!) 121/57 123/88  Pulse: 83 82 80 82  Resp: (!) 21 15 18 18   Temp: 98.6 F (37 C) 98.6 F (37 C) 98.4 F (36.9 C) 98 F (36.7 C)  TempSrc: Oral Oral Oral Oral  SpO2: 98% 100% 98% 100%  Weight:      Height:        CBC:  Recent Labs  Lab 01/12/19 0747  01/13/19 0212 01/15/19 0326  WBC 8.2  --  7.7 9.7  NEUTROABS 6.6  --  5.9  --   HGB 15.1   < > 12.3* 13.8  HCT 44.4   < > 36.9* 39.8  MCV 105.7*  --  106.0* 102.3*  PLT 702*  --  604* 660*   < > = values in this interval not displayed.    Basic Metabolic Panel:  Recent Labs  Lab 01/13/19 0212 01/15/19 0326  NA 136 134*  K 3.8 3.5  CL 107 103  CO2 21* 19*  GLUCOSE 87 96  BUN 11 12  CREATININE 1.22 0.95  CALCIUM 8.5* 8.6*   Lipid Panel:     Component Value Date/Time   CHOL 145 01/13/2019 0212   TRIG 97 01/13/2019 0212   HDL 39 (L) 01/13/2019 0212   CHOLHDL 3.7 01/13/2019 0212   VLDL 19 01/13/2019 0212   LDLCALC 87 01/13/2019 0212   HgbA1c:  Lab Results  Component Value Date   HGBA1C 5.1 01/13/2019   Urine Drug Screen:     Component Value Date/Time   LABOPIA NONE DETECTED 01/12/2019 0747   COCAINSCRNUR NONE DETECTED 01/12/2019 0747   LABBENZ NONE DETECTED 01/12/2019 0747   AMPHETMU NONE DETECTED 01/12/2019 0747   THCU NONE DETECTED 01/12/2019 0747   LABBARB NONE DETECTED 01/12/2019 0747    Alcohol Level     Component Value Date/Time   ETH <10 01/12/2019 0747    IMAGING  Ct Head Code Stroke Wo Contrast 01/12/2019 1. Equivocal for hyperdense left MCA. No hemorrhage or visible acute infarct.ASPECTS is 10. 2. Prominent atrophy and chronic  small vessel ischemia.   Ct Code Stroke Cta Head W/wo Contrast Ct Code Stroke Cta Neck W/wo Contrast 01/12/2019 1. Left M1 embolus/occlusion with downstream reconstitution. There is 92 cc penumbra with no core infarct by CT perfusion. 2. Limited atheromatous changes.   Ct Code Stroke Cta Cerebral Perfusion W/wo Contrast 01/12/2019 1. Left M1 embolus/occlusion with downstream reconstitution. There is 92 cc penumbra with no core infarct by CT perfusion. 2. Limited atheromatous changes.   Cerebral Angiogram S/P lt common carotid arteriogram followed bt complete revascularization of occluded Lt MCA at origin with x 1 pass with 74mm x 40 mm solitaire x  Retriever  device achieving a TICI 3 revascularization.  Mr Brain Wo Contrast 01/13/2019 1. Patchy small volume acute ischemic nonhemorrhagic infarcts involving the left basal ganglia and subcortical left frontal lobe as above. 2. Underlying age-related cerebral atrophy with moderate chronic microvascular ischemic disease.   2D Echocardiogram  1. The left ventricle has hyperdynamic systolic function, with an ejection fraction of >65%. The cavity size was normal. There is mildly increased left ventricular wall thickness. Left ventricular  diastolic Doppler parameters are indeterminate. No  evidence of left ventricular regional wall motion abnormalities.  2. The right ventricle has normal systolic function. The cavity was normal. There is no increase in right ventricular wall thickness.  3. Left atrial size was moderately dilated.  4. The pericardial effusion is localized near the right atrium.  5. Trivial pericardial effusion is present.  6. Tricuspid valve regurgitation is moderate.  7. The aortic valve is tricuspid. Mild thickening of the aortic valve. Mild calcification of the aortic valve. Aortic valve regurgitation is mild by color flow Doppler.  8. The aorta is normal unless otherwise noted.  9. No intracardiac thrombi or masses were  visualized.   PHYSICAL EXAM    Frail elderly Caucasian male not in distress.  He is hard of hearing.  Afebrile. Head is nontraumatic. Neck is supple without bruit.  Cardiac exam no murmur or gallop, however, irregularly irregular heart rate and rhythm. Lungs are clear to auscultation. Distal pulses are well felt. Neurological Exam ; Awake alert moderate dysarthric but can be understood.  Follows commands well.  Oriented to month and person, however not orientated to place and the year.  Diminished attention, registration and recall.  Extraocular movements are full range without nystagmus.  Blinks to threat bilaterally.  Right lower facial weakness.  Tongue midline.  Motor system exam shows subtle right upper extremity drift with weakness of right grip and intrinsic hand muscles. Symmetric lower extremity strength.  Sensation is intact bilaterally.  Deep tendon flexes are symmetric.  Finger-to-nose coordination is impaired on the right only.  Plantars are downgoing.  Gait not tested.   ASSESSMENT/PLAN [name] is a 85 y.o. male with history of thrombocythemia, hyperlipidemia, hypertension, atrial fibrillation not on anticoagulation presenting with aphasia and R sided weakness.   Stroke:   L basal ganglia infarct with L M1 cut off s/p IR with TICI3 reperfusion- embolic secondary to known AF not on [facility]  Code Stroke CT head hyperdense L MCA. No acute abnormality.     CTA head & neck L M1 occlusion  CT perfusion 92cc penumbra w/ no core infarct  Cerebral angio L MCA occlusion at origin w/ TICI3 revascularization  MRI  Patchy small L basal ganglia and subcoritcal L frontal lobe infarcts.   2D Echo EF >65%. No source of embolus. LA mod dilated.   LDL 87  HgbA1c 5.1  Eliquis for VTE prophylaxis  aspirin 81 mg daily prior to admission, now on Eliquis.  Given elevated Cre, currently on eliquis 2.5mg  bid. Continue Eliquis on discharge for stroke prevention.  therapy recommendations:  SNF  Disposition:  pending  (caregiver for wife who has dementia)  Atrial Fibrillation  Home anticoagulation:  none   Likely the cause of current stroke . added Eliquis for stroke prevention . Continue Eliquis on discharge . A. fib RVR resolved . On atenolol   Essential thrombocythemia  On hydroxyurea PTA  Jak 2 mutation positive  Platelet still high 604->660->798 (could be reactive)  Continue aspirin 81  Continue on discharge  Close follow-up with hematology Dr. Alen Blew.  AKI  Cre 0.95->2.22  Likely due to dehydration with decreased po intake due to dysphagia   Increase IVF to 75cc/h  Encourage po intake  BMP monitoring  eliquis readjusted to 2.40mb bid but will resume 5mg  bid once Cre < 1.5  Hypertension  Home meds: Atenolol 50, enalapril 20 bid, HCTZ 12.5  Stable  Resumed atenolol home meds.   Long-term BP goal normotensive  Hyperlipidemia  Home meds:  pravachol 80  LDL 87, goal < 70  Resumed Pravachol 80  Continue statin at discharge  Dysphagia . Secondary to stroke . Cleared for D2 nectar thick liquid diet w/ MBSS  . increase IVF to 75/hr . Speech on board   Other Stroke Risk Factors  Advanced age  Hx smokeless tobacco user  Hx ETOH use  Other Active Problems  BPH on flomax  Agitation - likely sundowning on 8/18 requiring restraints and haldol - 8/19 resolved -on Seroquel hs prn    Hospital day # 6  Rosalin Hawking, MD PhD Stroke Neurology 01/18/2019 3:48 PM    To contact Stroke Continuity provider, please refer to [email or web address] After hours, contact General Neurology

## 2019-01-19 ENCOUNTER — Encounter (HOSPITAL_COMMUNITY): Payer: Self-pay | Admitting: Interventional Radiology

## 2019-01-19 DIAGNOSIS — R1310 Dysphagia, unspecified: Secondary | ICD-10-CM

## 2019-01-19 DIAGNOSIS — I4891 Unspecified atrial fibrillation: Secondary | ICD-10-CM | POA: Diagnosis present

## 2019-01-19 DIAGNOSIS — R451 Restlessness and agitation: Secondary | ICD-10-CM | POA: Diagnosis not present

## 2019-01-19 DIAGNOSIS — E785 Hyperlipidemia, unspecified: Secondary | ICD-10-CM | POA: Diagnosis present

## 2019-01-19 DIAGNOSIS — Z7901 Long term (current) use of anticoagulants: Secondary | ICD-10-CM

## 2019-01-19 LAB — BASIC METABOLIC PANEL
Anion gap: 11 (ref 5–15)
BUN: 59 mg/dL — ABNORMAL HIGH (ref 8–23)
CO2: 20 mmol/L — ABNORMAL LOW (ref 22–32)
Calcium: 9.1 mg/dL (ref 8.9–10.3)
Chloride: 109 mmol/L (ref 98–111)
Creatinine, Ser: 1.67 mg/dL — ABNORMAL HIGH (ref 0.61–1.24)
GFR calc Af Amer: 42 mL/min — ABNORMAL LOW (ref >60)
GFR calc non Af Amer: 36 mL/min — ABNORMAL LOW (ref >60)
Glucose, Bld: 132 mg/dL — ABNORMAL HIGH (ref 70–99)
Potassium: 3.3 mmol/L — ABNORMAL LOW (ref 3.5–5.1)
Sodium: 140 mmol/L (ref 135–145)

## 2019-01-19 LAB — CBC
HCT: 39.5 % (ref 39.0–52.0)
Hemoglobin: 13.1 g/dL (ref 13.0–17.0)
MCH: 34.7 pg — ABNORMAL HIGH (ref 26.0–34.0)
MCHC: 33.2 g/dL (ref 30.0–36.0)
MCV: 104.8 fL — ABNORMAL HIGH (ref 80.0–100.0)
Platelets: 905 10*3/uL (ref 150–400)
RBC: 3.77 MIL/uL — ABNORMAL LOW (ref 4.22–5.81)
RDW: 12.9 % (ref 11.5–15.5)
WBC: 13.3 10*3/uL — ABNORMAL HIGH (ref 4.0–10.5)
nRBC: 0 % (ref 0.0–0.2)

## 2019-01-19 LAB — PATHOLOGIST SMEAR REVIEW

## 2019-01-19 NOTE — TOC Progression Note (Signed)
Transition of Care [facility]) - Progression Note    Patient Details  Name: Nicholas Contreras MRN: [id number] Date of Birth: 1929/01/20  Transition [facility] CM/SW [place], [place] Phone Number: 01/19/2019, 2:22 PM  Clinical Narrative:  CSW following for discharge plan. Patient has been accepted at [facility], and son would like to accept bed offer. CSW spoke with patient's son to confirm. [facility] has started insurance authorization process, and patient will need an updated COVID test. CSW discussed with Stroke NP Ivin Booty about barriers to discharge. CSW to follow.      Expected Discharge Plan: [place] Barriers to Discharge: Continued Medical Work up  Expected Discharge Plan and Services Expected Discharge Plan: [place] In-house Referral: Clinical Social Work Discharge Planning Services: CM Consult Post Acute Care Choice: [place]                                         Social Determinants of Health (SDOH) Interventions    Readmission Risk Interventions No flowsheet data found.

## 2019-01-19 NOTE — Progress Notes (Signed)
STROKE TEAM PROGRESS NOTE   INTERVAL HISTORY Pt lying in bed. No neuro changes. Still on IVF and Cre improving. Pending SNF.     Vitals:   01/19/19 0032 01/19/19 0331 01/19/19 0756 01/19/19 1106  BP: 123/70 132/88 134/78 119/85  Pulse: 89 92 92 97  Resp: (!) 21 20 (!) 23 (!) 26  Temp: 98.1 F (36.7 C) 97.8 F (36.6 C) 98.6 F (37 C) 98.9 F (37.2 C)  TempSrc: Oral Oral Oral Oral  SpO2: 98% 94% 100% 97%  Weight:      Height:        CBC:  Recent Labs  Lab 01/13/19 0212  01/18/19 1040 01/19/19 0355  WBC 7.7   < > 14.3* 13.3*  NEUTROABS 5.9  --   --   --   HGB 12.3*   < > 13.2 13.1  HCT 36.9*   < > 39.4 39.5  MCV 106.0*   < > 104.2* 104.8*  PLT 604*   < > 798* 905*   < > = values in this interval not displayed.    Basic Metabolic Panel:  Recent Labs  Lab 01/18/19 1040 01/19/19 0355  NA 140 140  K 3.2* 3.3*  CL 108 109  CO2 21* 20*  GLUCOSE 123* 132*  BUN 57* 59*  CREATININE 2.22* 1.67*  CALCIUM 8.9 9.1   Lipid Panel:     Component Value Date/Time   CHOL 145 01/13/2019 0212   TRIG 97 01/13/2019 0212   HDL 39 (L) 01/13/2019 0212   CHOLHDL 3.7 01/13/2019 0212   VLDL 19 01/13/2019 0212   LDLCALC 87 01/13/2019 0212   HgbA1c:  Lab Results  Component Value Date   HGBA1C 5.1 01/13/2019   Urine Drug Screen:     Component Value Date/Time   LABOPIA NONE DETECTED 01/12/2019 0747   COCAINSCRNUR NONE DETECTED 01/12/2019 0747   LABBENZ NONE DETECTED 01/12/2019 0747   AMPHETMU NONE DETECTED 01/12/2019 0747   THCU NONE DETECTED 01/12/2019 0747   LABBARB NONE DETECTED 01/12/2019 0747    Alcohol Level     Component Value Date/Time   ETH <10 01/12/2019 0747    IMAGING  Ct Head Code Stroke Wo Contrast 01/12/2019 1. Equivocal for hyperdense left MCA. No hemorrhage or visible acute infarct.ASPECTS is 10. 2. Prominent atrophy and chronic small vessel ischemia.   Ct Code Stroke Cta Head W/wo Contrast Ct Code Stroke Cta Neck W/wo Contrast 01/12/2019 1. Left  M1 embolus/occlusion with downstream reconstitution. There is 92 cc penumbra with no core infarct by CT perfusion. 2. Limited atheromatous changes.   Ct Code Stroke Cta Cerebral Perfusion W/wo Contrast 01/12/2019 1. Left M1 embolus/occlusion with downstream reconstitution. There is 92 cc penumbra with no core infarct by CT perfusion. 2. Limited atheromatous changes.   Cerebral Angiogram S/P lt common carotid arteriogram followed bt complete revascularization of occluded Lt MCA at origin with x 1 pass with 70mm x 40 mm solitaire x  Retriever  device achieving a TICI 3 revascularization.  Mr Brain Wo Contrast 01/13/2019 1. Patchy small volume acute ischemic nonhemorrhagic infarcts involving the left basal ganglia and subcortical left frontal lobe as above. 2. Underlying age-related cerebral atrophy with moderate chronic microvascular ischemic disease.   2D Echocardiogram  1. The left ventricle has hyperdynamic systolic function, with an ejection fraction of >65%. The cavity size was normal. There is mildly increased left ventricular wall thickness. Left ventricular diastolic Doppler parameters are indeterminate. No  evidence of left ventricular regional wall motion  abnormalities.  2. The right ventricle has normal systolic function. The cavity was normal. There is no increase in right ventricular wall thickness.  3. Left atrial size was moderately dilated.  4. The pericardial effusion is localized near the right atrium.  5. Trivial pericardial effusion is present.  6. Tricuspid valve regurgitation is moderate.  7. The aortic valve is tricuspid. Mild thickening of the aortic valve. Mild calcification of the aortic valve. Aortic valve regurgitation is mild by color flow Doppler.  8. The aorta is normal unless otherwise noted.  9. No intracardiac thrombi or masses were visualized.   PHYSICAL EXAM     Frail elderly Caucasian male not in distress.  He is hard of hearing.  Afebrile. Head is  nontraumatic. Neck is supple without bruit.  Cardiac exam no murmur or gallop, however, irregularly irregular heart rate and rhythm. Lungs are clear to auscultation. Distal pulses are well felt. Neurological Exam ; Awake alert moderate dysarthric but can be understood.  Follows commands well.  Oriented to month and person, however not orientated to place and the year.  Diminished attention, registration and recall.  Extraocular movements are full range without nystagmus.  Blinks to threat bilaterally.  Right lower facial weakness.  Tongue midline.  Motor system exam shows subtle right upper extremity drift with weakness of right grip and intrinsic hand muscles. Symmetric lower extremity strength.  Sensation is intact bilaterally.  Deep tendon flexes are symmetric.  Finger-to-nose coordination is impaired on the right only.  Plantars are downgoing.  Gait not tested.   ASSESSMENT/PLAN [name] is a 85 y.o. male with history of thrombocythemia, hyperlipidemia, hypertension, atrial fibrillation not on anticoagulation presenting with aphasia and R sided weakness.   Stroke:   L basal ganglia infarct with L M1 cut off s/p IR with TICI3 reperfusion- embolic secondary to known AF not on [facility]  Code Stroke CT head hyperdense L MCA. No acute abnormality.     CTA head & neck L M1 occlusion  CT perfusion 92cc penumbra w/ no core infarct  Cerebral angio L MCA occlusion at origin w/ TICI3 revascularization  MRI  Patchy small L basal ganglia and subcoritcal L frontal lobe infarcts.   2D Echo EF >65%. No source of embolus. LA mod dilated.   LDL 87  HgbA1c 5.1  Eliquis for VTE prophylaxis  aspirin 81 mg daily prior to admission, now on Eliquis.  Given elevated Cre, currently on eliquis 2.5mg  bid. Continue Eliquis on discharge for stroke prevention.  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 awaiting insurance auth. Repeat COVID test for d/c  ordered.   Atrial Fibrillation w/ RVR  Home anticoagulation:  none   Likely the cause of current stroke . added Eliquis for stroke prevention . Continue Eliquis on discharge . A. fib RVR resolved . On atenolol   Essential thrombocythemia  On hydroxyurea PTA  Jak 2 mutation positive  Platelet still quite high 604->660->798->905  Continue aspirin 81  Will touch base with his ematologist Dr. Alen Blew.  AKI  Cre 0.95->2.22->1.67  Likely due to dehydration with decreased po intake due to dysphagia   continue IVF to 75cc/h  Encourage po intake  BMP monitoring  eliquis readjusted to 2.29mb bid but will resume 5mg  bid once Cre < 1.5  Hypertension  Home meds: Atenolol 50, enalapril 20 bid, HCTZ 12.5  Stable now  Resumed atenolol, hold off enalapril and HCTZ due to elevated Cre  Long-term BP  goal normotensive  Hyperlipidemia  Home meds:  pravachol 80  LDL 87, goal < 70  Resumed Pravachol 80  Continue statin at discharge  Dysphagia . Secondary to stroke . Cleared for D2 nectar thick liquid diet w/ MBSS  . on IVF @ 75/hr . Speech on board   Other Stroke Risk Factors  Advanced age  Hx smokeless tobacco user  Hx ETOH use  Other Active Problems  BPH on flomax  Agitation - likely sundowning on 8/18 requiring restraints and haldol - 8/19 resolved -on Seroquel hs prn   Hospital day # 7  Rosalin Hawking, MD PhD Stroke Neurology 01/19/2019 12:49 PM    To contact Stroke Continuity provider, please refer to [email or web address] After hours, contact General Neurology

## 2019-01-19 NOTE — Progress Notes (Signed)
CRITICAL VALUE ALERT  Critical Value: Platelet 905  Date & Time Notied:  01/19/19 at 0628  Provider Notified: Dr. Leonel Ramsay  Orders Received/Actions taken: none. Awaiting on response from MD.

## 2019-01-19 NOTE — Care Management Important Message (Signed)
Important Message  Patient Details  Name: Nicholas Contreras MRN: [id number] Date of Birth: 01-19-1929   Medicare Important Message Given:  Yes     [name] 01/19/2019, 3:06 PM

## 2019-01-19 NOTE — Progress Notes (Signed)
Physical Therapy Treatment Patient Details Name: Nicholas Contreras MRN: [id number] DOB: 1929/03/20 Today's Date: 01/19/2019    History of Present Illness Patient is a 85 y/o male who presents with right sided weakness and aphasia. CTA- Left M1 occlusion s/p mechanical thrombectomy 8/17 achieving revascularization. Brain MRI-patchy infarcts left basal ganglia and subcortical left frontal lobe. PMH includes A-fib not on anticoagulation, HTN, HLD.    PT Comments    Pt lethargic, difficult to arouse for participation in session. He was able to sit EOB with min to mod assist for a short period and attempt to stand with steady standing frame with max assist.  After mobility attempts, I attempted to help him eat breakfast and pt held food in mouth and was unable to stay awake enough to eat, food suctioned out of mouth.  PT will continue to follow acutely for safe mobility progression  Follow Up Recommendations  SNF;Supervision/Assistance - 24 hour     Equipment Recommendations  Wheelchair (measurements PT);Wheelchair cushion (measurements PT);Hospital bed    Recommendations for Other Services   NA     Precautions / Restrictions Precautions Precautions: Fall Precaution Comments: monitor HR, L lean    Mobility  Bed Mobility Overal bed mobility: Needs Assistance Bed Mobility: Supine to Sit;Sit to Supine     Supine to sit: Total assist;HOB elevated Sit to supine: Max assist   General bed mobility comments: Total assist with HOB maximally elevated to come to sitting EOB.  Pt not helping to initiate or follow through with coming to sitting.  He was able to assist (with gravity) to lower to supine, but needed help of both legs and to reposition trunk/scoot up once supine with max assist.    Transfers Overall transfer level: Needs assistance Equipment used: None Transfers: Sit to/from Stand Sit to Stand: Max assist;From elevated surface         General transfer comment: Max assist for  partial stand at EOB with steady standing frame.  Pt with strong left lateral lean in standing and unable to fully extend hips.    Ambulation/Gait             General Gait Details: unable at this time.        Modified Rankin (Stroke Patients Only) Modified Rankin (Stroke Patients Only) Pre-Morbid Rankin Score: No significant disability Modified Rankin: Severe disability     Balance Overall balance assessment: Needs assistance Sitting-balance support: Feet supported;Bilateral upper extremity supported Sitting balance-Leahy Scale: Poor Sitting balance - Comments: min to mod assist in sitting due to left and posterior lateral lean.   Postural control: Posterior lean;Left lateral lean Standing balance support: Bilateral upper extremity supported Standing balance-Leahy Scale: Zero Standing balance comment: max assist for partial stand                            Cognition Arousal/Alertness: Lethargic Behavior During Therapy: Flat affect Overall Cognitive Status: Impaired/Different from baseline Area of Impairment: Orientation;Attention;Memory;Following commands;Awareness;Safety/judgement;Problem solving                 Orientation Level: Disoriented to;Situation;Time Current Attention Level: Focused Memory: Decreased recall of precautions;Decreased short-term memory Following Commands: Follows one step commands inconsistently;Follows one step commands with increased time Safety/Judgement: Decreased awareness of safety;Decreased awareness of deficits Awareness: Intellectual Problem Solving: Slow processing;Decreased initiation;Difficulty sequencing;Requires verbal cues;Requires tactile cues General Comments: Pt very lethargic.  PT turned on all lights, opened windows as pt was in the dark and very  sleepy.  He had no awareness of leaning left, unable to correct with cues, attempted to assist in feeding EOB, however, pt falling asleep and holding food in his  mouth, so food suctioned out and pt returned to supine.              Pertinent Vitals/Pain Pain Assessment: No/denies pain           PT Goals (current goals can now be found in the care plan section) Acute Rehab PT Goals Patient Stated Goal: unable to state Progress towards PT goals: Not progressing toward goals - comment    Frequency    Min 3X/week      PT Plan Current plan remains appropriate       AM-PAC PT "6 Clicks" Mobility   Outcome Measure  Help needed turning from your back to your side while in a flat bed without using bedrails?: Total Help needed moving from lying on your back to sitting on the side of a flat bed without using bedrails?: Total Help needed moving to and from a bed to a chair (including a wheelchair)?: Total Help needed standing up from a chair using your arms (e.g., wheelchair or bedside chair)?: Total Help needed to walk in hospital room?: Total Help needed climbing 3-5 steps with a railing? : Total 6 Click Score: 6    End of Session   Activity Tolerance: Patient limited by fatigue;Patient limited by lethargy Patient left: in bed;with call bell/phone within reach;with bed alarm set;Other (comment);with SCD's reapplied(in chair mode)   PT Visit Diagnosis: Hemiplegia and hemiparesis;Difficulty in walking, not elsewhere classified (R26.2);Muscle weakness (generalized) (M62.81);Unsteadiness on feet (R26.81) Hemiplegia - Right/Left: Right Hemiplegia - dominant/non-dominant: Dominant Hemiplegia - caused by: Cerebral infarction     Time: [id number] PT Time Calculation (min) (ACUTE ONLY): 32 min  Charges:  $Therapeutic Activity: 23-37 mins                     Jennafer Gladue B. Erica Osuna, PT, DPT  Acute Rehabilitation [phone] pager [phone] office  @ [name]: [phone]   01/19/2019, 11:11 AM

## 2019-01-19 NOTE — Progress Notes (Signed)
Pt requested for coffee and insisted on drinking it himself. Pt picked up the coffee to drink himself and accidentally spilled it on himself. Skin reddened; Vaseline applied; MD on-call paged and notified. New orders received. Will continue to closely monitor. P. A mo  [employer] RN

## 2019-01-19 NOTE — Progress Notes (Signed)
  Speech Language Pathology Treatment: Dysphagia  Patient Details Name: Nicholas Contreras MRN: [id number] DOB: 1929-04-22 Today's Date: 01/19/2019 Time: 1157-1209 SLP Time Calculation (min) (ACUTE ONLY): 12 min  Assessment / Plan / Recommendation Clinical Impression  Limited session for dysphagia tx today.  Pt lethargic, required max verbal/tactile prompts to stay awake and participate.  Lunch tray at bedside. Pt accepted several sips of nectar-thick liquid, requiring max verbal/tactile cueing to tuck his chin for airway protection.  No solids were offered given degree of drowsiness.  D/W NT.  Hold tray until more alert.  SLP will continue to follow to address dysphagia and cognitive/communication goals.    HPI HPI: Nicholas Contreras is a 85 y.o. male admitted with speech disturbance, aphasia and decreased mobility. MRI Patchy small volume acute ischemic nonhemorrhagic infarcts involving the left basal ganglia and subcortical left frontal lobe, Underlying age-related cerebral atrophy with moderate chronic microvascular ischemic disease.      SLP Plan  Continue with current plan of care       Recommendations  Diet recommendations: Dysphagia 2 (fine chop);Nectar-thick liquid Liquids provided via: Cup Medication Administration: Crushed with puree Supervision: Staff to assist with self feeding;Full supervision/cueing for compensatory strategies Compensations: Chin tuck;Clear throat intermittently;Slow rate;Small sips/bites Postural Changes and/or Swallow Maneuvers: Chin tuck                Oral Care Recommendations: Oral care BID Follow up Recommendations: Inpatient Rehab;24 hour supervision/assistance SLP Visit Diagnosis: Dysphagia, oropharyngeal phase (R13.12) Plan: Continue with current plan of care       GO                Juan Quam Laurice 01/19/2019, 12:09 PM  Rabecca Birge L. Tivis Ringer, [place] Office number [phone] Pager  [phone]

## 2019-01-20 DIAGNOSIS — R310 Gross hematuria: Secondary | ICD-10-CM

## 2019-01-20 LAB — BASIC METABOLIC PANEL
Anion gap: 8 (ref 5–15)
BUN: 51 mg/dL — ABNORMAL HIGH (ref 8–23)
CO2: 21 mmol/L — ABNORMAL LOW (ref 22–32)
Calcium: 9.2 mg/dL (ref 8.9–10.3)
Chloride: 115 mmol/L — ABNORMAL HIGH (ref 98–111)
Creatinine, Ser: 1.57 mg/dL — ABNORMAL HIGH (ref 0.61–1.24)
GFR calc Af Amer: 45 mL/min — ABNORMAL LOW (ref >60)
GFR calc non Af Amer: 39 mL/min — ABNORMAL LOW (ref >60)
Glucose, Bld: 113 mg/dL — ABNORMAL HIGH (ref 70–99)
Potassium: 3.5 mmol/L (ref 3.5–5.1)
Sodium: 144 mmol/L (ref 135–145)

## 2019-01-20 LAB — CBC
HCT: 39.4 % (ref 39.0–52.0)
Hemoglobin: 12.8 g/dL — ABNORMAL LOW (ref 13.0–17.0)
MCH: 34.6 pg — ABNORMAL HIGH (ref 26.0–34.0)
MCHC: 32.5 g/dL (ref 30.0–36.0)
MCV: 106.5 fL — ABNORMAL HIGH (ref 80.0–100.0)
Platelets: 1055 10*3/uL (ref 150–400)
RBC: 3.7 MIL/uL — ABNORMAL LOW (ref 4.22–5.81)
RDW: 13.1 % (ref 11.5–15.5)
WBC: 12.5 10*3/uL — ABNORMAL HIGH (ref 4.0–10.5)
nRBC: 0 % (ref 0.0–0.2)

## 2019-01-20 LAB — URINALYSIS, COMPLETE (UACMP) WITH MICROSCOPIC
Bilirubin Urine: NEGATIVE
Glucose, UA: NEGATIVE mg/dL
Ketones, ur: NEGATIVE mg/dL
Leukocytes,Ua: NEGATIVE
Nitrite: NEGATIVE
Protein, ur: 30 mg/dL — AB
RBC / HPF: >50 RBC/hpf — ABNORMAL HIGH (ref 0–5)
Specific Gravity, Urine: 1.017 (ref 1.005–1.030)
pH: 5 (ref 5.0–8.0)

## 2019-01-20 MED ORDER — APIXABAN 5 MG PO TABS
5.0000 mg | ORAL_TABLET | Freq: Two times a day (BID) | ORAL | Status: DC
  Administered 2019-01-20 (×2): 5 mg via ORAL
  Filled 2019-01-20 (×2): qty 1

## 2019-01-20 NOTE — Progress Notes (Signed)
  Speech Language Pathology Treatment: Dysphagia;Cognitive-Linquistic  Patient Details Name: Nicholas Contreras MRN: [id number] DOB: 05/04/1929 Today's Date: 01/20/2019 Time: [id number] SLP Time Calculation (min) (ACUTE ONLY): 14 min  Assessment / Plan / Recommendation Clinical Impression  Pt appears to be more alert this afternoon compared to SLP visit on previous date. He needed Max cues to recall items on his lunch tray, still with only ~25% accuracy. He is able to recall the need for a chin tuck with Min cues, but he needs Mod cues for implementation, primarily to maintain a full chin tuck position. No overt signs of aspiration were observed, although he did have eructation and reported feeling like liquids were "coming back up" after drinking about 4 ounces of nectar thick liquid. Given how full he felt, solids were not offered at this time. Would continue current diet and precautions for now.   HPI HPI: Nicholas Contreras is a 85 y.o. male admitted with speech disturbance, aphasia and decreased mobility. MRI Patchy small volume acute ischemic nonhemorrhagic infarcts involving the left basal ganglia and subcortical left frontal lobe, Underlying age-related cerebral atrophy with moderate chronic microvascular ischemic disease.      SLP Plan  Continue with current plan of care       Recommendations  Diet recommendations: Dysphagia 2 (fine chop);Nectar-thick liquid Liquids provided via: Cup Medication Administration: Crushed with puree Supervision: Staff to assist with self feeding;Full supervision/cueing for compensatory strategies Compensations: Chin tuck;Clear throat intermittently;Slow rate;Small sips/bites Postural Changes and/or Swallow Maneuvers: Chin tuck                Oral Care Recommendations: Oral care BID Follow up Recommendations: Inpatient Rehab;24 hour supervision/assistance SLP Visit Diagnosis: Dysphagia, oropharyngeal phase (R13.12) Plan: Continue with current plan of  care       GO                Venita Sheffield Roberth Berling 01/20/2019, 4:50 PM  Pollyann Glen, M.A. [place] Acute Environmental education officer [phone] Office [phone]

## 2019-01-20 NOTE — Progress Notes (Addendum)
STROKE TEAM PROGRESS NOTE   INTERVAL HISTORY Pt lying in bed, no acute distress. Not able to tell me the month. Still on IVF. Cre improving. COVID pending. RN reported mild hematuria in urine, will need to check UA again. Platelet continues to elevate, discussed with Dr. Alen Blew and likely reactive.    Vitals:   01/19/19 2331 01/20/19 0314 01/20/19 0742 01/20/19 1107  BP: 137/74 136/89 (!) 143/85 118/77  Pulse: 87 88 99 84  Resp: 15 16 (!) 25 (!) 21  Temp: 98 F (36.7 C) 98.5 F (36.9 C) 98.7 F (37.1 C) 98.2 F (36.8 C)  TempSrc: Oral Oral Oral Oral  SpO2: 98% 98% 95% 99%  Weight:      Height:        CBC:  Recent Labs  Lab 01/19/19 0355 01/20/19 0346  WBC 13.3* 12.5*  HGB 13.1 12.8*  HCT 39.5 39.4  MCV 104.8* 106.5*  PLT 905* 1,055*    Basic Metabolic Panel:  Recent Labs  Lab 01/19/19 0355 01/20/19 0346  NA 140 144  K 3.3* 3.5  CL 109 115*  CO2 20* 21*  GLUCOSE 132* 113*  BUN 59* 51*  CREATININE 1.67* 1.57*  CALCIUM 9.1 9.2   Lipid Panel:     Component Value Date/Time   CHOL 145 01/13/2019 0212   TRIG 97 01/13/2019 0212   HDL 39 (L) 01/13/2019 0212   CHOLHDL 3.7 01/13/2019 0212   VLDL 19 01/13/2019 0212   LDLCALC 87 01/13/2019 0212   HgbA1c:  Lab Results  Component Value Date   HGBA1C 5.1 01/13/2019   Urine Drug Screen:     Component Value Date/Time   LABOPIA NONE DETECTED 01/12/2019 0747   COCAINSCRNUR NONE DETECTED 01/12/2019 0747   LABBENZ NONE DETECTED 01/12/2019 0747   AMPHETMU NONE DETECTED 01/12/2019 0747   THCU NONE DETECTED 01/12/2019 0747   LABBARB NONE DETECTED 01/12/2019 0747    Alcohol Level     Component Value Date/Time   ETH <10 01/12/2019 0747   Urinalysis    Component Value Date/Time   COLORURINE AMBER (A) 01/18/2019 1211   APPEARANCEUR HAZY (A) 01/18/2019 1211   LABSPEC 1.019 01/18/2019 1211   PHURINE 5.0 01/18/2019 1211   GLUCOSEU NEGATIVE 01/18/2019 1211   HGBUR LARGE (A) 01/18/2019 1211   BILIRUBINUR NEGATIVE  01/18/2019 1211   KETONESUR NEGATIVE 01/18/2019 1211   PROTEINUR 30 (A) 01/18/2019 1211   NITRITE NEGATIVE 01/18/2019 1211   LEUKOCYTESUR TRACE (A) 01/18/2019 1211    IMAGING  Ct Head Code Stroke Wo Contrast 01/12/2019 1. Equivocal for hyperdense left MCA. No hemorrhage or visible acute infarct.ASPECTS is 10. 2. Prominent atrophy and chronic small vessel ischemia.   Ct Code Stroke Cta Head W/wo Contrast Ct Code Stroke Cta Neck W/wo Contrast 01/12/2019 1. Left M1 embolus/occlusion with downstream reconstitution. There is 92 cc penumbra with no core infarct by CT perfusion. 2. Limited atheromatous changes.   Ct Code Stroke Cta Cerebral Perfusion W/wo Contrast 01/12/2019 1. Left M1 embolus/occlusion with downstream reconstitution. There is 92 cc penumbra with no core infarct by CT perfusion. 2. Limited atheromatous changes.   Cerebral Angiogram S/P lt common carotid arteriogram followed bt complete revascularization of occluded Lt MCA at origin with x 1 pass with 46mm x 40 mm solitaire x  Retriever  device achieving a TICI 3 revascularization.  Mr Brain Wo Contrast 01/13/2019 1. Patchy small volume acute ischemic nonhemorrhagic infarcts involving the left basal ganglia and subcortical left frontal lobe as above. 2. Underlying  age-related cerebral atrophy with moderate chronic microvascular ischemic disease.   2D Echocardiogram  1. The left ventricle has hyperdynamic systolic function, with an ejection fraction of >65%. The cavity size was normal. There is mildly increased left ventricular wall thickness. Left ventricular diastolic Doppler parameters are indeterminate. No  evidence of left ventricular regional wall motion abnormalities.  2. The right ventricle has normal systolic function. The cavity was normal. There is no increase in right ventricular wall thickness.  3. Left atrial size was moderately dilated.  4. The pericardial effusion is localized near the right atrium.  5. Trivial  pericardial effusion is present.  6. Tricuspid valve regurgitation is moderate.  7. The aortic valve is tricuspid. Mild thickening of the aortic valve. Mild calcification of the aortic valve. Aortic valve regurgitation is mild by color flow Doppler.  8. The aorta is normal unless otherwise noted.  9. No intracardiac thrombi or masses were visualized.   PHYSICAL EXAM      Frail elderly Caucasian male not in distress.  He is hard of hearing.  Afebrile. Head is nontraumatic. Neck is supple without bruit.  Cardiac exam no murmur or gallop, however, irregularly irregular heart rate and rhythm. Lungs are clear to auscultation. Distal pulses are well felt. Neurological Exam ; Awake alert moderate dysarthric but can be understood.  Follows commands well.  Oriented to place and year and person, however not orientated to month.  Diminished attention, registration and recall.  Extraocular movements are full range without nystagmus.  Blinks to threat bilaterally.  Right lower facial weakness.  Tongue midline.  Symmetric upper and lower extremity strength.  Sensation is intact bilaterally.  Deep tendon flexes are symmetric.  Finger-to-nose intact bilaterally but slow action.  Plantars are downgoing.  Gait not tested.   ASSESSMENT/PLAN Nicholas Contreras is a 85 y.o. male with history of thrombocythemia, hyperlipidemia, hypertension, atrial fibrillation not on anticoagulation presenting with aphasia and R sided weakness.   Stroke:   L basal ganglia infarct with L M1 cut off s/p IR with TICI3 reperfusion- embolic secondary to known AF not on [facility]  Code Stroke CT head hyperdense L MCA. No acute abnormality.     CTA head & neck L M1 occlusion  CT perfusion 92cc penumbra w/ no core infarct  Cerebral angio L MCA occlusion at origin w/ TICI3 revascularization  MRI  Patchy small L basal ganglia and subcoritcal L frontal lobe infarcts.   2D Echo EF >65%. No source of embolus. LA mod dilated.   LDL 87  HgbA1c  5.1  Eliquis for VTE prophylaxis  aspirin 81 mg daily prior to admission, now on Eliquis, increased to 5mg  bid 8/25 d/t normalizing Cre. Continue Eliquis on discharge for stroke prevention.  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 Have received insurance auth.   Now with possible hematuria on Eliquis. Will evaluate  Repeat COVID test for d/c ordered - pending   Hematuria  UA w/ 21-50 RBC 8/12  Repeat UA today pending   Hgb 12.8  Continue eliquis now but d/c ASA  Atrial Fibrillation w/ RVR  Home anticoagulation:  none   Likely the cause of current stroke . added Eliquis for stroke prevention . Continue Eliquis on discharge . A. fib RVR resolved . On atenolol   Essential thrombocythemia  On hydroxyurea PTA  Jak 2 mutation positive  Platelet still quite high 604->660->798->905->1055, likely reactive  Discontinue aspirin 81   Discussed with Dr. Alen Blew  who felt it is reactive, no new intervention necessary  AKI  Cre 0.95->2.22->1.67->1.57  Likely due to dehydration with decreased po intake due to dysphagia   continue IVF @ 75cc/h  Encourage po intake  BMP monitoring  eliquis increased to 5mg  bid  Hypertension  Home meds: Atenolol 50, enalapril 20 bid, HCTZ 12.5  Stable now  Resumed atenolol, hold off enalapril and HCTZ due to elevated Cre  Long-term BP goal normotensive  Hyperlipidemia  Home meds:  pravachol 80  LDL 87, goal < 70  Resumed Pravachol 80  Continue statin at discharge  Dysphagia . Secondary to stroke . Cleared for D2 nectar thick liquid diet w/ MBSS  . on IVF @ 75/hr . Speech on board   Other Stroke Risk Factors  Advanced age  Hx smokeless tobacco user  Hx ETOH use  Other Active Problems  BPH on flomax  Agitation - likely sundowning on 8/18 requiring restraints and haldol - 8/19 resolved -on Seroquel hs prn   Hospital day # 8  Rosalin Hawking, MD PhD Stroke  Neurology 01/20/2019 2:13 PM  I had long discussion with son over the phone, updated pt current condition, treatment plan and potential prognosis. He expressed understanding and appreciation. I also discussed with Dr. Alen Blew over the phone.    To contact Stroke Continuity provider, please refer to [email or web address] After hours, contact General Neurology

## 2019-01-20 NOTE — Progress Notes (Signed)
Occupational Therapy Treatment Patient Details Name: Nicholas Contreras MRN: [id number] DOB: [birth year] Today's Date: 01/20/2019    History of present illness Patient is a 85 y/o male who presents with right sided weakness and aphasia. CTA- Left M1 occlusion s/p mechanical thrombectomy 8/17 achieving revascularization. Brain MRI-patchy infarcts left basal ganglia and subcortical left frontal lobe. PMH includes A-fib not on anticoagulation, HTN, HLD.   OT comments  Pt calling out into hallway and requesting something to drink. Pt needing mod multimodal cuing for sequencing and mod A for supine >sit. Pt able to sit on EOB for 10 minutes with min - mod A for sitting balance. Hand over hand to bring cup to mouth with mod cuing for small sips and chin tuck. Pt returning to bed and B hand mitt's donned for safety. Pt continues to benefit from OT intervention.    Follow Up Recommendations  SNF;Supervision/Assistance - 24 hour    Equipment Recommendations  Other (comment)(defer to next venue of care)       Precautions / Restrictions Precautions Precautions: Fall Precaution Comments: monitor HR, L lean Restrictions Weight Bearing Restrictions: No       Mobility i Bed Mobility Overal bed mobility: Needs Assistance Bed Mobility: Supine to Sit;Sit to Supine     Supine to sit: Mod assist Sit to supine: Mod assist   General bed mobility comments: assistance to manage B LEs sit <>supine.     Balance Overall balance assessment: Needs assistance Sitting-balance support: Feet supported;Bilateral upper extremity supported Sitting balance-Leahy Scale: Poor Sitting balance - Comments: min A to for static sitting balance. Pt needing mod A for sitting balance when drinking juice with hand over hand assistance          ADL either performed or assessed with clinical judgement   ADL Overall ADL's : Needs assistance/impaired Eating/Feeding: Total assistance Eating/Feeding Details (indicate cue  type and reason): hand over hand assistance                      Cognition Arousal/Alertness: Awake/alert Behavior During Therapy: Flat affect Overall Cognitive Status: Impaired/Different from baseline Area of Impairment: Orientation;Attention;Memory;Following commands;Awareness;Safety/judgement;Problem solving      Orientation Level: Disoriented to;Place;Time;Situation Current Attention Level: Focused Memory: Decreased recall of precautions;Decreased short-term memory Following Commands: Follows one step commands inconsistently;Follows one step commands with increased time Safety/Judgement: Decreased awareness of safety;Decreased awareness of deficits Awareness: Intellectual Problem Solving: Slow processing;Decreased initiation;Difficulty sequencing;Requires verbal cues;Requires tactile cues General Comments: Pt unsure of time of day and reports he is currently in [place]                   Pertinent Vitals/ Pain       Pain Assessment: Faces Faces Pain Scale: No hurt      Frequency  Min 2X/week        Progress Toward Goals  OT Goals(current goals can now be found in the care plan section)  Progress towards OT goals: Progressing toward goals  Acute Rehab OT Goals Patient Stated Goal: unable to state OT Goal Formulation: Patient unable to participate in goal setting Potential to Achieve Goals: [place] Discharge plan needs to be updated       AM-PAC OT "6 Clicks" Daily Activity     Outcome Measure   Help from another person eating meals?: Total Help from another person taking care of personal grooming?: A Lot Help from another person toileting, which includes using toliet, bedpan, or urinal?: Total Help from  another person bathing (including washing, rinsing, drying)?: A Lot Help from another person to put on and taking off regular upper body clothing?: A Lot Help from another person to put on and taking off regular lower body clothing?: Total 6 Click  Score: 9    End of Session    OT Visit Diagnosis: Muscle weakness (generalized) (M62.81);Other symptoms and signs involving the nervous system (R29.898)   Activity Tolerance Patient tolerated treatment well   Patient Left in bed;with call bell/phone within reach;with bed alarm set;Other (comment)(B hand mitt's donned)   Nurse Communication Mobility status        Time: [id number] OT Time Calculation (min): 15 min  Charges: OT General Charges $OT Visit: 1 Visit OT Treatments $Self Care/Home Management : 8-22 mins   Courtni Balash P, MS, OTR/L 01/20/2019, 12:18 PM

## 2019-01-21 DIAGNOSIS — R3129 Other microscopic hematuria: Secondary | ICD-10-CM

## 2019-01-21 DIAGNOSIS — N390 Urinary tract infection, site not specified: Secondary | ICD-10-CM

## 2019-01-21 LAB — CBC
HCT: 22.2 % — ABNORMAL LOW (ref 39.0–52.0)
Hemoglobin: 7.4 g/dL — ABNORMAL LOW (ref 13.0–17.0)
MCH: 34.9 pg — ABNORMAL HIGH (ref 26.0–34.0)
MCHC: 33.3 g/dL (ref 30.0–36.0)
MCV: 104.7 fL — ABNORMAL HIGH (ref 80.0–100.0)
Platelets: 1542 10*3/uL (ref 150–400)
RBC: 2.12 MIL/uL — ABNORMAL LOW (ref 4.22–5.81)
RDW: 13.4 % (ref 11.5–15.5)
WBC: 12.3 10*3/uL — ABNORMAL HIGH (ref 4.0–10.5)
nRBC: 0 % (ref 0.0–0.2)

## 2019-01-21 LAB — HEMOGLOBIN AND HEMATOCRIT, BLOOD
HCT: 37.4 % — ABNORMAL LOW (ref 39.0–52.0)
Hemoglobin: 12.2 g/dL — ABNORMAL LOW (ref 13.0–17.0)

## 2019-01-21 LAB — BASIC METABOLIC PANEL
Anion gap: 10 (ref 5–15)
BUN: 48 mg/dL — ABNORMAL HIGH (ref 8–23)
CO2: 18 mmol/L — ABNORMAL LOW (ref 22–32)
Calcium: 9.1 mg/dL (ref 8.9–10.3)
Chloride: 118 mmol/L — ABNORMAL HIGH (ref 98–111)
Creatinine, Ser: 1.58 mg/dL — ABNORMAL HIGH (ref 0.61–1.24)
GFR calc Af Amer: 45 mL/min — ABNORMAL LOW (ref >60)
GFR calc non Af Amer: 38 mL/min — ABNORMAL LOW (ref >60)
Glucose, Bld: 113 mg/dL — ABNORMAL HIGH (ref 70–99)
Potassium: 3.9 mmol/L (ref 3.5–5.1)
Sodium: 146 mmol/L — ABNORMAL HIGH (ref 135–145)

## 2019-01-21 LAB — NOVEL CORONAVIRUS, NAA (HOSP ORDER, SEND-OUT TO REF LAB; TAT 18-24 HRS): SARS-CoV-2, NAA: NOT DETECTED

## 2019-01-21 MED ORDER — APIXABAN 2.5 MG PO TABS
2.5000 mg | ORAL_TABLET | Freq: Two times a day (BID) | ORAL | Status: DC
  Administered 2019-01-21 – 2019-01-26 (×10): 2.5 mg via ORAL
  Filled 2019-01-21 (×10): qty 1

## 2019-01-21 MED ORDER — SODIUM CHLORIDE 0.9 % IV SOLN
1.0000 g | INTRAVENOUS | Status: DC
  Administered 2019-01-21 – 2019-01-24 (×4): 1 g via INTRAVENOUS
  Filled 2019-01-21 (×4): qty 10

## 2019-01-21 MED ORDER — APIXABAN 2.5 MG PO TABS: 2.5000 mg | ORAL_TABLET | Freq: Two times a day (BID) | ORAL | Status: DC

## 2019-01-21 NOTE — Progress Notes (Signed)
STROKE TEAM PROGRESS NOTE   INTERVAL HISTORY Pt lying in bed, no significant neuro change, still orientated x 3. Still has severe dysarthria. His Hb this am was at 7.4, UA showed RBC > 50, therefore eliquis was discontinued. UA showed many bacteria, started on rocephin. Leukocytosis better, no fever. Repeat Hb was 12.2. therefore, this am hb could be lab error.    Vitals:   01/21/19 0309 01/21/19 0700 01/21/19 1100 01/21/19 1145  BP: 125/67 (!) 158/92 124/81   Pulse: 88 97  90  Resp: 18     Temp: 98.3 F (36.8 C) 98.2 F (36.8 C) 98.3 F (36.8 C)   TempSrc: Oral Oral Oral   SpO2: 99% 98% 99%   Weight:      Height:        CBC:  Recent Labs  Lab 01/20/19 0346 01/21/19 0355 01/21/19 1408  WBC 12.5* 12.3*  --   HGB 12.8* 7.4* 12.2*  HCT 39.4 22.2* 37.4*  MCV 106.5* 104.7*  --   PLT 1,055* 1,542*  --     Basic Metabolic Panel:  Recent Labs  Lab 01/20/19 0346 01/21/19 0355  NA 144 146*  K 3.5 3.9  CL 115* 118*  CO2 21* 18*  GLUCOSE 113* 113*  BUN 51* 48*  CREATININE 1.57* 1.58*  CALCIUM 9.2 9.1   Lipid Panel:     Component Value Date/Time   CHOL 145 01/13/2019 0212   TRIG 97 01/13/2019 0212   HDL 39 (L) 01/13/2019 0212   CHOLHDL 3.7 01/13/2019 0212   VLDL 19 01/13/2019 0212   LDLCALC 87 01/13/2019 0212   HgbA1c:  Lab Results  Component Value Date   HGBA1C 5.1 01/13/2019   Urine Drug Screen:     Component Value Date/Time   LABOPIA NONE DETECTED 01/12/2019 0747   COCAINSCRNUR NONE DETECTED 01/12/2019 0747   LABBENZ NONE DETECTED 01/12/2019 0747   AMPHETMU NONE DETECTED 01/12/2019 0747   THCU NONE DETECTED 01/12/2019 0747   LABBARB NONE DETECTED 01/12/2019 0747    Alcohol Level     Component Value Date/Time   ETH <10 01/12/2019 0747   Urinalysis    Component Value Date/Time   COLORURINE AMBER (A) 01/20/2019 1758   APPEARANCEUR HAZY (A) 01/20/2019 1758   LABSPEC 1.017 01/20/2019 1758   PHURINE 5.0 01/20/2019 1758   GLUCOSEU NEGATIVE  01/20/2019 1758   HGBUR LARGE (A) 01/20/2019 1758   BILIRUBINUR NEGATIVE 01/20/2019 1758   KETONESUR NEGATIVE 01/20/2019 1758   PROTEINUR 30 (A) 01/20/2019 1758   NITRITE NEGATIVE 01/20/2019 1758   LEUKOCYTESUR NEGATIVE 01/20/2019 1758    IMAGING Ct Head Code Stroke Wo Contrast 01/12/2019 1. Equivocal for hyperdense left MCA. No hemorrhage or visible acute infarct.ASPECTS is 10. 2. Prominent atrophy and chronic small vessel ischemia.   Ct Code Stroke Cta Head W/wo Contrast Ct Code Stroke Cta Neck W/wo Contrast 01/12/2019 1. Left M1 embolus/occlusion with downstream reconstitution. There is 92 cc penumbra with no core infarct by CT perfusion. 2. Limited atheromatous changes.   Ct Code Stroke Cta Cerebral Perfusion W/wo Contrast 01/12/2019 1. Left M1 embolus/occlusion with downstream reconstitution. There is 92 cc penumbra with no core infarct by CT perfusion. 2. Limited atheromatous changes.   Cerebral Angiogram S/P lt common carotid arteriogram followed bt complete revascularization of occluded Lt MCA at origin with x 1 pass with 3mm x 40 mm solitaire x  Retriever  device achieving a TICI 3 revascularization.  Mr Brain Wo Contrast 01/13/2019 1. Patchy small volume acute  ischemic nonhemorrhagic infarcts involving the left basal ganglia and subcortical left frontal lobe as above. 2. Underlying age-related cerebral atrophy with moderate chronic microvascular ischemic disease.   2D Echocardiogram  1. The left ventricle has hyperdynamic systolic function, with an ejection fraction of >65%. The cavity size was normal. There is mildly increased left ventricular wall thickness. Left ventricular diastolic Doppler parameters are indeterminate. No  evidence of left ventricular regional wall motion abnormalities.  2. The right ventricle has normal systolic function. The cavity was normal. There is no increase in right ventricular wall thickness.  3. Left atrial size was moderately dilated.  4. The  pericardial effusion is localized near the right atrium.  5. Trivial pericardial effusion is present.  6. Tricuspid valve regurgitation is moderate.  7. The aortic valve is tricuspid. Mild thickening of the aortic valve. Mild calcification of the aortic valve. Aortic valve regurgitation is mild by color flow Doppler.  8. The aorta is normal unless otherwise noted.  9. No intracardiac thrombi or masses were visualized.   PHYSICAL EXAM       Frail elderly Caucasian male not in distress.  He is hard of hearing.  Afebrile. Head is nontraumatic. Neck is supple without bruit.  Cardiac exam no murmur or gallop, however, irregularly irregular heart rate and rhythm. Lungs are clear to auscultation. Distal pulses are well felt. Neurological Exam ; Awake alert moderate to severe dysarthric but hardly to be understood.  Follows commands well.  Oriented to place and year and person, however not orientated to month.  Diminished attention, registration and recall.  Extraocular movements are full range without nystagmus.  Blinks to threat bilaterally.  Right lower facial weakness.  Tongue midline.  Symmetric upper and lower extremity strength.  Sensation is intact bilaterally.  Deep tendon flexes are symmetric.  Finger-to-nose intact bilaterally but slow action.  Plantars are downgoing.  Gait not tested.   ASSESSMENT/PLAN Mr. JIMY BOUGHTER is a 85 y.o. male with history of thrombocythemia, hyperlipidemia, hypertension, atrial fibrillation not on anticoagulation presenting with aphasia and R sided weakness.   Stroke:   L basal ganglia infarct with L M1 cut off s/p IR with TICI3 reperfusion- embolic secondary to known AF not on [facility]  Code Stroke CT head hyperdense L MCA. No acute abnormality.     CTA head & neck L M1 occlusion  CT perfusion 92cc penumbra w/ no core infarct  Cerebral angio L MCA occlusion at origin w/ TICI3 revascularization  MRI  Patchy small L basal ganglia and subcoritcal L frontal lobe  infarcts.   2D Echo EF >65%. No source of embolus. LA mod dilated.   LDL 87  HgbA1c 5.1  Eliquis for VTE prophylaxis  aspirin 81 mg daily prior to admission, on eliquis now. Given Cre > 1.5, now on eliquis 2.5mg  bid. Consider increase to 5mg  once Cre < 1.5    therapy recommendations: SNF  Disposition:  pending  (caregiver for wife who has dementia). Have received insurance auth.   Repeat COVID test for d/c - neg   Microscopic hematuria on Eliquis  UA w/ 21-50 RBC 8/12  Repeat UA today > 50 RBC, many bact, 30 prot  UCx pending   Started empiric Rocephin 8/26  However, no gross hematuria  ?? Anemia   Hgb 12.8->7.4 over night - repeat afternoon 12.2   Likely lab error    UA showed RBC > 50 but no gross hematuria  Continue eliquis 2.5mg  bid  Atrial Fibrillation w/ RVR  Home anticoagulation:  none   Likely the cause of current stroke . added Eliquis for stroke prevention . Continue Eliquis on discharge . A. fib RVR resolved . On atenolol   Essential thrombocythemia  On hydroxyurea PTA  Jak 2 mutation positive  Platelet still quite high 604->660->798->905->1055->1542, likely reactive  Discontinue aspirin 81   Discussed with Dr. Alen Blew who felt it is reactive, no new intervention necessary  AKI  Cre 0.95->2.22->1.67->1.57->1.58  Likely due to dehydration with decreased po intake due to dysphagia   continue IVF @ 75cc/h  Encourage po intake  BMP monitoring  Hypertension  Home meds: Atenolol 50, enalapril 20 bid, HCTZ 12.5  Stable now  Resumed atenolol, hold off enalapril and HCTZ due to elevated Cre  Long-term BP goal normotensive  Hyperlipidemia  Home meds:  pravachol 80  LDL 87, goal < 70  Resumed Pravachol 80  Continue statin at discharge  Dysphagia . Secondary to stroke . D2 nectar thick liquid diet w/ MBSS  . on IVF @ 75/hr . Speech on board   Other Stroke Risk Factors  Advanced age  Hx smokeless tobacco user  Hx  ETOH use  Other Active Problems  BPH on flomax  Agitation - likely sundowning on 8/18 requiring restraints and haldol - 8/19 resolved -on Seroquel hs prn   Hospital day # 9  Rosalin Hawking, MD PhD Stroke Neurology 01/21/2019 3:13 PM   To contact Stroke Continuity provider, please refer to [email or web address] After hours, contact General Neurology

## 2019-01-21 NOTE — Progress Notes (Signed)
Physical Therapy Treatment Patient Details Name: Nicholas Contreras MRN: [id number] DOB: 1929/01/24 Today's Date: 01/21/2019    History of Present Illness Patient is a 85 y/o male who presents with right sided weakness and aphasia. CTA- Left M1 occlusion s/p mechanical thrombectomy 8/17 achieving revascularization. Brain MRI-patchy infarcts left basal ganglia and subcortical left frontal lobe. PMH includes A-fib not on anticoagulation, HTN, HLD.    PT Comments    Pt did mildly better in standing today, was more engaging and alert seated EOB.  Son helping to engage him and this was helpful.  We stood multiple times with two person heavy assist and support of RW.  He is still unable to walk.  PT will continue to follow acutely for safe mobility progression  Follow Up Recommendations  SNF;Supervision/Assistance - 24 hour     Equipment Recommendations  Wheelchair (measurements PT);Wheelchair cushion (measurements PT);Hospital bed    Recommendations for Other Services   NA     Precautions / Restrictions Precautions Precautions: Fall Precaution Comments: monitor HR, L lean, left sided weakness.     Mobility  Bed Mobility Overal bed mobility: Needs Assistance Bed Mobility: Supine to Sit;Sit to Supine     Supine to sit: Max assist;HOB elevated Sit to supine: Max assist;HOB elevated   General bed mobility comments: Max assist and multimodal cues used to get pt to intiate movement towards EOB.  Assist provided at trunk and legs.   Transfers Overall transfer level: Needs assistance Equipment used: Rolling walker (2 wheeled) Transfers: Sit to/from Stand Sit to Stand: +2 physical assistance;Max assist         General transfer comment: Two person max assist to stand from elevated bed with RW stabilized.  Pt's left knee blocked and pt needed assist to power up and then to shift right in standing and extend hips.    Ambulation/Gait             General Gait Details: unable at this  time.     Modified Rankin (Stroke Patients Only) Modified Rankin (Stroke Patients Only) Pre-Morbid Rankin Score: No significant disability Modified Rankin: Severe disability     Balance Overall balance assessment: Needs assistance Sitting-balance support: Feet supported;Bilateral upper extremity supported Sitting balance-Leahy Scale: Poor Sitting balance - Comments: up to mod assist in sitting due to left lateral lean. Pt does well reaching to the end of the bed rail for stability in sitting .  Postural control: Left lateral lean Standing balance support: Bilateral upper extremity supported Standing balance-Leahy Scale: Zero Standing balance comment: max assist for full stand with RW today.  left lateral lean and flexed hips in standing.                             Cognition Arousal/Alertness: Lethargic(became more alert EOB.) Behavior During Therapy: WFL for tasks assessed/performed Overall Cognitive Status: Impaired/Different from baseline Area of Impairment: Orientation;Attention;Following commands;Safety/judgement;Awareness;Problem solving                 Orientation Level: Place(could say hospital, did not know the name) Current Attention Level: Sustained Memory: Decreased short-term memory Following Commands: Follows one step commands with increased time Safety/Judgement: Decreased awareness of safety;Decreased awareness of deficits Awareness: Emergent Problem Solving: Slow processing;Decreased initiation;Difficulty sequencing;Requires verbal cues;Requires tactile cues General Comments: Pt slow to process commands at times, needs help initiating and sequencing movement towards EOB.  Lethargic initially, but perked up when EOB.  Exercises General Exercises - Lower Extremity Long Arc Quad: AROM;Both;10 reps    General Comments        Pertinent Vitals/Pain Pain Assessment: No/denies pain           PT Goals (current goals can now be found  in the care plan section) Acute Rehab PT Goals Patient Stated Goal: to get stronger again Progress towards PT goals: Progressing toward goals    Frequency    Min 3X/week      PT Plan Current plan remains appropriate       AM-PAC PT "6 Clicks" Mobility   Outcome Measure  Help needed turning from your back to your side while in a flat bed without using bedrails?: Total Help needed moving from lying on your back to sitting on the side of a flat bed without using bedrails?: Total Help needed moving to and from a bed to a chair (including a wheelchair)?: Total Help needed standing up from a chair using your arms (e.g., wheelchair or bedside chair)?: Total Help needed to walk in hospital room?: Total Help needed climbing 3-5 steps with a railing? : Total 6 Click Score: 6    End of Session Equipment Utilized During Treatment: Gait belt Activity Tolerance: Patient limited by fatigue Patient left: in bed;with call bell/phone within reach;with bed alarm set;with family/visitor present   PT Visit Diagnosis: Hemiplegia and hemiparesis;Difficulty in walking, not elsewhere classified (R26.2);Muscle weakness (generalized) (M62.81);Unsteadiness on feet (R26.81) Hemiplegia - Right/Left: Right Hemiplegia - dominant/non-dominant: Dominant Hemiplegia - caused by: Cerebral infarction     Time: [id number] PT Time Calculation (min) (ACUTE ONLY): 28 min  Charges:  $Therapeutic Activity: 23-37 mins                    Lorenna Lurry B. Prim Morace, PT, DPT  Acute Rehabilitation [phone] pager [phone] office  @ [name]: [phone]    01/21/2019, 6:30 PM

## 2019-01-21 NOTE — Progress Notes (Signed)
  Speech Language Pathology Treatment: Dysphagia  Patient Details Name: Nicholas Contreras MRN: [id number] DOB: 10-Feb-1929 Today's Date: 01/21/2019 Time: [id number] SLP Time Calculation (min) (ACUTE ONLY): 13 min  Assessment / Plan / Recommendation Clinical Impression  Pt quite sleepy again this am, acknowledging that he wanted time undisturbed so that he may rest. Pt was repositioned and double mitts removed from right hand.  With assistance to secure cup, pt drank several ounces of nectar-thick liquid, requiring verbal/tactile cues in at least 60% of opportunities to tuck his chin.  There were no overt s/s of aspiration, no belching today.  He was oriented to person, place, and basic situation (stroke).  He declined further POs, asking to sleep.  Pt will likely D/C on current diet; will need orders to return as an OP for repeat MBS.   HPI HPI: Nicholas Contreras is a 85 y.o. male admitted with speech disturbance, aphasia and decreased mobility. MRI Patchy small volume acute ischemic nonhemorrhagic infarcts involving the left basal ganglia and subcortical left frontal lobe, Underlying age-related cerebral atrophy with moderate chronic microvascular ischemic disease.      SLP Plan  Continue with current plan of care       Recommendations  Diet recommendations: Dysphagia 2 (fine chop);Nectar-thick liquid Liquids provided via: Cup Medication Administration: Crushed with puree Supervision: Staff to assist with self feeding;Full supervision/cueing for compensatory strategies Compensations: Chin tuck;Clear throat intermittently;Slow rate;Small sips/bites Postural Changes and/or Swallow Maneuvers: Chin tuck                Oral Care Recommendations: Oral care BID Follow up Recommendations: Skilled Nursing facility SLP Visit Diagnosis: Dysphagia, oropharyngeal phase (R13.12) Plan: Continue with current plan of care       GO                Juan Quam Laurice 01/21/2019, 12:00  PM  Zeniyah Peaster L. Tivis Ringer, [place] Office number [phone] Pager [phone]

## 2019-01-22 DIAGNOSIS — E870 Hyperosmolality and hypernatremia: Secondary | ICD-10-CM

## 2019-01-22 LAB — GLUCOSE, CAPILLARY
Glucose-Capillary: 98 mg/dL (ref 70–99)
Glucose-Capillary: 90 mg/dL (ref 70–99)
Glucose-Capillary: 124 mg/dL — ABNORMAL HIGH (ref 70–99)

## 2019-01-22 LAB — URINALYSIS, COMPLETE (UACMP) WITH MICROSCOPIC
Bilirubin Urine: NEGATIVE
Bilirubin Urine: NEGATIVE
Glucose, UA: NEGATIVE mg/dL
Glucose, UA: NEGATIVE mg/dL
Ketones, ur: NEGATIVE mg/dL
Ketones, ur: NEGATIVE mg/dL
Nitrite: NEGATIVE
Nitrite: NEGATIVE
Protein, ur: 30 mg/dL — AB
Protein, ur: 30 mg/dL — AB
RBC / HPF: >50 RBC/hpf — ABNORMAL HIGH (ref 0–5)
Specific Gravity, Urine: 1.016 (ref 1.005–1.030)
Specific Gravity, Urine: 1.017 (ref 1.005–1.030)
pH: 5 (ref 5.0–8.0)
pH: 5 (ref 5.0–8.0)

## 2019-01-22 LAB — CBC
HCT: 35.4 % — ABNORMAL LOW (ref 39.0–52.0)
Hemoglobin: 11.2 g/dL — ABNORMAL LOW (ref 13.0–17.0)
MCH: 34.4 pg — ABNORMAL HIGH (ref 26.0–34.0)
MCHC: 31.6 g/dL (ref 30.0–36.0)
MCV: 108.6 fL — ABNORMAL HIGH (ref 80.0–100.0)
Platelets: 1174 10*3/uL (ref 150–400)
RBC: 3.26 MIL/uL — ABNORMAL LOW (ref 4.22–5.81)
RDW: 13.7 % (ref 11.5–15.5)
WBC: 6.6 10*3/uL (ref 4.0–10.5)
nRBC: 0 % (ref 0.0–0.2)

## 2019-01-22 LAB — BASIC METABOLIC PANEL
Anion gap: 9 (ref 5–15)
BUN: 49 mg/dL — ABNORMAL HIGH (ref 8–23)
CO2: 22 mmol/L (ref 22–32)
Calcium: 8.9 mg/dL (ref 8.9–10.3)
Chloride: 120 mmol/L — ABNORMAL HIGH (ref 98–111)
Creatinine, Ser: 1.75 mg/dL — ABNORMAL HIGH (ref 0.61–1.24)
GFR calc Af Amer: 39 mL/min — ABNORMAL LOW (ref >60)
GFR calc non Af Amer: 34 mL/min — ABNORMAL LOW (ref >60)
Glucose, Bld: 126 mg/dL — ABNORMAL HIGH (ref 70–99)
Potassium: 3.7 mmol/L (ref 3.5–5.1)
Sodium: 151 mmol/L — ABNORMAL HIGH (ref 135–145)

## 2019-01-22 MED ORDER — DEXTROSE 5 % IV SOLN
INTRAVENOUS | Status: DC
  Administered 2019-01-22 – 2019-01-25 (×4): via INTRAVENOUS

## 2019-01-22 MED ORDER — FLEET ENEMA 7-19 GM/118ML RE ENEM
1.0000 | ENEMA | Freq: Once | RECTAL | Status: AC
  Administered 2019-01-22: 1 via RECTAL
  Filled 2019-01-22: qty 1

## 2019-01-22 NOTE — Care Management Important Message (Signed)
Important Message  Patient Details  Name: Nicholas Contreras MRN: [id number] Date of Birth: May 26, 1929   Medicare Important Message Given:  Yes     [name] [name] 01/22/2019, 3:38 PM

## 2019-01-22 NOTE — Progress Notes (Signed)
STROKE TEAM PROGRESS NOTE   INTERVAL HISTORY Pt lying in bed, no neuro changes, orientated x 3, severe dysarthria. His Hb this am was at 11.2, leukocytosis resolved, continue rocephin. However, Na and Cre continue to elevate, will change NS to D5.    Vitals:   01/21/19 2326 01/22/19 0324 01/22/19 0724 01/22/19 1124  BP: 114/86 107/73 125/80 125/78  Pulse: 70 72 82 91  Resp: 18 18 18 20   Temp: 98.4 F (36.9 C) 98.6 F (37 C) 98.4 F (36.9 C) 98.4 F (36.9 C)  TempSrc: Oral Oral Axillary Oral  SpO2: 100% 100% 100% 100%  Weight:      Height:        CBC:  Recent Labs  Lab 01/21/19 0355 01/21/19 1408 01/22/19 0351  WBC 12.3*  --  6.6  HGB 7.4* 12.2* 11.2*  HCT 22.2* 37.4* 35.4*  MCV 104.7*  --  108.6*  PLT 1,542*  --  1,174*    Basic Metabolic Panel:  Recent Labs  Lab 01/21/19 0355 01/22/19 0351  NA 146* 151*  K 3.9 3.7  CL 118* 120*  CO2 18* 22  GLUCOSE 113* 126*  BUN 48* 49*  CREATININE 1.58* 1.75*  CALCIUM 9.1 8.9   Lipid Panel:     Component Value Date/Time   CHOL 145 01/13/2019 0212   TRIG 97 01/13/2019 0212   HDL 39 (L) 01/13/2019 0212   CHOLHDL 3.7 01/13/2019 0212   VLDL 19 01/13/2019 0212   LDLCALC 87 01/13/2019 0212   HgbA1c:  Lab Results  Component Value Date   HGBA1C 5.1 01/13/2019   Urine Drug Screen:     Component Value Date/Time   LABOPIA NONE DETECTED 01/12/2019 0747   COCAINSCRNUR NONE DETECTED 01/12/2019 0747   LABBENZ NONE DETECTED 01/12/2019 0747   AMPHETMU NONE DETECTED 01/12/2019 0747   THCU NONE DETECTED 01/12/2019 0747   LABBARB NONE DETECTED 01/12/2019 0747    Alcohol Level     Component Value Date/Time   ETH <10 01/12/2019 0747   Urinalysis    Component Value Date/Time   COLORURINE AMBER (A) 01/20/2019 1758   APPEARANCEUR HAZY (A) 01/20/2019 1758   LABSPEC 1.017 01/20/2019 1758   PHURINE 5.0 01/20/2019 1758   GLUCOSEU NEGATIVE 01/20/2019 1758   HGBUR LARGE (A) 01/20/2019 1758   BILIRUBINUR NEGATIVE 01/20/2019  1758   KETONESUR NEGATIVE 01/20/2019 1758   PROTEINUR 30 (A) 01/20/2019 1758   NITRITE NEGATIVE 01/20/2019 1758   LEUKOCYTESUR NEGATIVE 01/20/2019 1758    IMAGING Ct Head Code Stroke Wo Contrast 01/12/2019 1. Equivocal for hyperdense left MCA. No hemorrhage or visible acute infarct.ASPECTS is 10. 2. Prominent atrophy and chronic small vessel ischemia.   Ct Code Stroke Cta Head W/wo Contrast Ct Code Stroke Cta Neck W/wo Contrast 01/12/2019 1. Left M1 embolus/occlusion with downstream reconstitution. There is 92 cc penumbra with no core infarct by CT perfusion. 2. Limited atheromatous changes.   Ct Code Stroke Cta Cerebral Perfusion W/wo Contrast 01/12/2019 1. Left M1 embolus/occlusion with downstream reconstitution. There is 92 cc penumbra with no core infarct by CT perfusion. 2. Limited atheromatous changes.   Cerebral Angiogram S/P lt common carotid arteriogram followed bt complete revascularization of occluded Lt MCA at origin with x 1 pass with 63mm x 40 mm solitaire x  Retriever  device achieving a TICI 3 revascularization.  Mr Brain Wo Contrast 01/13/2019 1. Patchy small volume acute ischemic nonhemorrhagic infarcts involving the left basal ganglia and subcortical left frontal lobe as above. 2. Underlying age-related  cerebral atrophy with moderate chronic microvascular ischemic disease.   2D Echocardiogram  1. The left ventricle has hyperdynamic systolic function, with an ejection fraction of >65%. The cavity size was normal. There is mildly increased left ventricular wall thickness. Left ventricular diastolic Doppler parameters are indeterminate. No  evidence of left ventricular regional wall motion abnormalities.  2. The right ventricle has normal systolic function. The cavity was normal. There is no increase in right ventricular wall thickness.  3. Left atrial size was moderately dilated.  4. The pericardial effusion is localized near the right atrium.  5. Trivial pericardial  effusion is present.  6. Tricuspid valve regurgitation is moderate.  7. The aortic valve is tricuspid. Mild thickening of the aortic valve. Mild calcification of the aortic valve. Aortic valve regurgitation is mild by color flow Doppler.  8. The aorta is normal unless otherwise noted.  9. No intracardiac thrombi or masses were visualized.   PHYSICAL EXAM       Frail elderly Caucasian male not in distress.  He is hard of hearing.  Afebrile. Head is nontraumatic. Neck is supple without bruit.  Cardiac exam no murmur or gallop, however, irregularly irregular heart rate and rhythm. Lungs are clear to auscultation. Distal pulses are well felt. Neurological Exam ; Awake alert moderate to severe dysarthric but hardly to be understood.  Follows commands well.  Oriented to place and year and person, however not orientated to month.  Diminished attention, registration and recall.  Extraocular movements are full range without nystagmus.  Blinks to threat bilaterally.  Right lower facial weakness.  Tongue midline.  Symmetric upper and lower extremity strength.  Sensation is intact bilaterally.  Deep tendon flexes are symmetric.  Finger-to-nose intact bilaterally but slow action.  Plantars are downgoing.  Gait not tested.   ASSESSMENT/PLAN Nicholas Contreras is a 85 y.o. male with history of thrombocythemia, hyperlipidemia, hypertension, atrial fibrillation not on anticoagulation presenting with aphasia and R sided weakness.   Stroke:   L basal ganglia infarct with L M1 cut off s/p IR with TICI3 reperfusion- embolic secondary to known AF not on [facility]  Code Stroke CT head hyperdense L MCA. No acute abnormality.     CTA head & neck L M1 occlusion  CT perfusion 92cc penumbra w/ no core infarct  Cerebral angio L MCA occlusion at origin w/ TICI3 revascularization  MRI  Patchy small L basal ganglia and subcoritcal L frontal lobe infarcts.   2D Echo EF >65%. No source of embolus. LA mod dilated.   LDL  87  HgbA1c 5.1  Eliquis for VTE prophylaxis  aspirin 81 mg daily prior to admission, on eliquis now. Given Cre > 1.5, now on eliquis 2.5mg  bid. Consider increase to 5mg  once Cre < 1.5    therapy recommendations: SNF  Disposition:  pending  (caregiver for wife who has dementia). Have received insurance auth.   Repeat COVID test for d/c - neg   Microscopic hematuria on Eliquis  UA w/ 21-50 RBC 8/12  Repeat UA today > 50 RBC, many bact, 30 prot  UCx pending   UA repeat pending  Started empiric Rocephin 8/26  However, no gross hematuria  ?? Anemia   Hgb 12.8->7.4 over night - repeat afternoon 12.2 ->11.2  Likely lab error 8/26   UA showed RBC > 50 but no gross hematuria  Repeat UA pending  Continue eliquis 2.5mg  bid  Atrial Fibrillation w/ RVR  Home anticoagulation:  none   Likely the cause of  current stroke . added Eliquis for stroke prevention . Continue Eliquis on discharge . A. fib RVR resolved . On atenolol   Essential thrombocythemia  On hydroxyurea PTA  Jak 2 mutation positive  Platelet still quite high 604->660->798->905->1055->1542->1174, likely reactive  Discontinue aspirin 81   Discussed with Dr. Alen Blew who felt it is reactive, no new intervention necessary  AKI  Cre 0.95->2.22->1.67->1.57->1.58->1.75  Likely due to dehydration with decreased po intake due to dysphagia   continue IVF @ 75cc/h  Change to D5 from NS  Encourage po intake  BMP monitoring  Hypernatremia  Na 144->146->151  Change NS to D5  BMP monitoring  Encourage po intake.   Hypertension  Home meds: Atenolol 50, enalapril 20 bid, HCTZ 12.5  Stable now  Resumed atenolol, hold off enalapril and HCTZ due to elevated Cre  Long-term BP goal normotensive  Hyperlipidemia  Home meds:  pravachol 80  LDL 87, goal < 70  Resumed Pravachol 80  Continue statin at discharge  Dysphagia . Secondary to stroke . D2 nectar thick liquid diet w/ MBSS  . on IVF  @ 75/hr . Speech on board   Other Stroke Risk Factors  Advanced age  Hx smokeless tobacco user  Hx ETOH use  Other Active Problems  BPH on flomax  Agitation - likely sundowning on 8/18 requiring restraints and haldol - 8/19 resolved -on Seroquel hs prn   Hospital day # 10  I spent  35 minutes in total face-to-face time with the patient, more than 50% of which was spent in counseling and coordination of care, reviewing test results, images and medication, and discussing the diagnosis of stroke, AKI, hypernatremia, anemia, dysphagia, treatment plan and potential prognosis. This patient's care requiresreview of multiple databases, neurological assessment, discussion with family, other specialists and medical decision making of high complexity.  Rosalin Hawking, MD PhD Stroke Neurology 01/22/2019 1:29 PM   To contact Stroke Continuity provider, please refer to [email or web address] After hours, contact General Neurology

## 2019-01-23 ENCOUNTER — Inpatient Hospital Stay (HOSPITAL_COMMUNITY): Payer: Medicare Other

## 2019-01-23 LAB — BASIC METABOLIC PANEL
Anion gap: 9 (ref 5–15)
BUN: 45 mg/dL — ABNORMAL HIGH (ref 8–23)
CO2: 19 mmol/L — ABNORMAL LOW (ref 22–32)
Calcium: 9.1 mg/dL (ref 8.9–10.3)
Chloride: 117 mmol/L — ABNORMAL HIGH (ref 98–111)
Creatinine, Ser: 1.82 mg/dL — ABNORMAL HIGH (ref 0.61–1.24)
GFR calc Af Amer: 38 mL/min — ABNORMAL LOW (ref >60)
GFR calc non Af Amer: 32 mL/min — ABNORMAL LOW (ref >60)
Glucose, Bld: 127 mg/dL — ABNORMAL HIGH (ref 70–99)
Potassium: 3.5 mmol/L (ref 3.5–5.1)
Sodium: 145 mmol/L (ref 135–145)

## 2019-01-23 LAB — URINALYSIS, ROUTINE W REFLEX MICROSCOPIC
Bilirubin Urine: NEGATIVE
Glucose, UA: NEGATIVE mg/dL
Ketones, ur: NEGATIVE mg/dL
Leukocytes,Ua: NEGATIVE
Nitrite: NEGATIVE
Protein, ur: 30 mg/dL — AB
RBC / HPF: >50 RBC/hpf — ABNORMAL HIGH (ref 0–5)
Specific Gravity, Urine: 1.015 (ref 1.005–1.030)
pH: 5 (ref 5.0–8.0)

## 2019-01-23 LAB — CBC
HCT: 38.4 % — ABNORMAL LOW (ref 39.0–52.0)
Hemoglobin: 12 g/dL — ABNORMAL LOW (ref 13.0–17.0)
MCH: 34.3 pg — ABNORMAL HIGH (ref 26.0–34.0)
MCHC: 31.3 g/dL (ref 30.0–36.0)
MCV: 109.7 fL — ABNORMAL HIGH (ref 80.0–100.0)
Platelets: 1181 10*3/uL (ref 150–400)
RBC: 3.5 MIL/uL — ABNORMAL LOW (ref 4.22–5.81)
RDW: 13.8 % (ref 11.5–15.5)
WBC: 8.7 10*3/uL (ref 4.0–10.5)
nRBC: 0 % (ref 0.0–0.2)

## 2019-01-23 LAB — GLUCOSE, CAPILLARY
Glucose-Capillary: 110 mg/dL — ABNORMAL HIGH (ref 70–99)
Glucose-Capillary: 105 mg/dL — ABNORMAL HIGH (ref 70–99)
Glucose-Capillary: 125 mg/dL — ABNORMAL HIGH (ref 70–99)
Glucose-Capillary: 113 mg/dL — ABNORMAL HIGH (ref 70–99)

## 2019-01-23 LAB — CK: Total CK: 22 U/L — ABNORMAL LOW (ref 49–397)

## 2019-01-23 LAB — SODIUM, URINE, RANDOM: Sodium, Ur: 16 mmol/L

## 2019-01-23 MED ORDER — STERILE WATER FOR INJECTION IV SOLN
INTRAVENOUS | Status: DC
  Administered 2019-01-23 (×2): via INTRAVENOUS
  Filled 2019-01-23 (×3): qty 850

## 2019-01-23 NOTE — Progress Notes (Signed)
Physical Therapy Treatment Patient Details Name: Nicholas Contreras MRN: [id number] DOB: [birth year] Today's Date: 01/23/2019    History of Present Illness Patient is a 85 y/o male who presents with right sided weakness and aphasia. CTA- Left M1 occlusion s/p mechanical thrombectomy 8/17 achieving revascularization. Brain MRI-patchy infarcts left basal ganglia and subcortical left frontal lobe. PMH includes A-fib not on anticoagulation, HTN, HLD.    PT Comments    Pt a bit more confused today, however, his son was here last session helping to keep him oriented.  He was able to participate at a similar level compared to last session and did eat a bit sitting EOB (magic cup and pureed fruit).  He continues to need two person assist to stand with RW with left lateral lean.  He remains appropriate for SNF for post acute rehab.  PT will continue to follow acutely for safe mobility progression  Follow Up Recommendations  SNF;Supervision/Assistance - 24 hour     Equipment Recommendations  Wheelchair (measurements PT);Wheelchair cushion (measurements PT);Hospital bed    Recommendations for Other Services   NA     Precautions / Restrictions Precautions Precautions: Fall Precaution Comments: monitor HR, L lean, left sided weakness.  Restrictions Weight Bearing Restrictions: No    Mobility  Bed Mobility Overal bed mobility: Needs Assistance Bed Mobility: Supine to Sit;Sit to Supine     Supine to sit: Max assist;HOB elevated;+2 for physical assistance Sit to supine: Max assist;HOB elevated;+2 for physical assistance   General bed mobility comments: Max assist and multimodal cues used to get pt to intiate movement towards EOB.  Assist provided at trunk and legs.   Transfers Overall transfer level: Needs assistance Equipment used: Rolling walker (2 wheeled) Transfers: Sit to/from Stand Sit to Stand: Max assist;+2 physical assistance         General transfer comment: Pt's R knee blocked  and assist required to assist pt to full standing.         Balance Overall balance assessment: Needs assistance Sitting-balance support: Feet supported;Bilateral upper extremity supported Sitting balance-Leahy Scale: Poor Sitting balance - Comments: up to mod assist in sitting due to left lateral lean. Pt does well reaching to the end of the bed rail for stability in sitting .  Postural control: Left lateral lean Standing balance support: Bilateral upper extremity supported Standing balance-Leahy Scale: Zero Standing balance comment: maxA for full standing, left lateral lean                            Cognition Arousal/Alertness: Lethargic Behavior During Therapy: WFL for tasks assessed/performed Overall Cognitive Status: Impaired/Different from baseline Area of Impairment: Orientation;Attention;Following commands;Safety/judgement;Awareness;Problem solving                 Orientation Level: Place Current Attention Level: Sustained Memory: Decreased short-term memory Following Commands: Follows one step commands with increased time Safety/Judgement: Decreased awareness of safety;Decreased awareness of deficits Awareness: Emergent Problem Solving: Slow processing;Decreased initiation;Difficulty sequencing;Requires verbal cues;Requires tactile cues General Comments: Pt slow to process commands at times, needs help initiating and sequencing movement towards EOB.  Lethargic initially, but perked up when EOB.           General Comments General comments (skin integrity, edema, etc.): HR stable today 90s-110s      Pertinent Vitals/Pain Pain Assessment: No/denies pain           PT Goals (current goals can now be found in the care plan section)  Acute Rehab PT Goals Patient Stated Goal: to get stronger again Progress towards PT goals: Progressing toward goals    Frequency    Min 3X/week      PT Plan Current plan remains appropriate    Co-evaluation  PT/OT/SLP Co-Evaluation/Treatment: Yes Reason for Co-Treatment: Complexity of the patient's impairments (multi-system involvement);Necessary to address cognition/behavior during functional activity;For patient/therapist safety;To address functional/ADL transfers PT goals addressed during session: Mobility/safety with mobility;Balance;Proper use of DME;Strengthening/ROM OT goals addressed during session: ADL's and self-care      AM-PAC PT "6 Clicks" Mobility   Outcome Measure  Help needed turning from your back to your side while in a flat bed without using bedrails?: Total Help needed moving from lying on your back to sitting on the side of a flat bed without using bedrails?: Total Help needed moving to and from a bed to a chair (including a wheelchair)?: Total Help needed standing up from a chair using your arms (e.g., wheelchair or bedside chair)?: Total Help needed to walk in hospital room?: Total Help needed climbing 3-5 steps with a railing? : Total 6 Click Score: 6    End of Session Equipment Utilized During Treatment: Gait belt Activity Tolerance: Patient limited by fatigue Patient left: in bed;with call bell/phone within reach;with bed alarm set;with restraints reapplied   PT Visit Diagnosis: Hemiplegia and hemiparesis;Difficulty in walking, not elsewhere classified (R26.2);Muscle weakness (generalized) (M62.81);Unsteadiness on feet (R26.81) Hemiplegia - Right/Left: Right Hemiplegia - dominant/non-dominant: Dominant Hemiplegia - caused by: Cerebral infarction     Time: [id number] PT Time Calculation (min) (ACUTE ONLY): 31 min  Charges:  $Therapeutic Activity: 8-22 mins             Nicholas Contreras B. Nicholas Contreras, PT, DPT  Acute Rehabilitation [phone] pager [phone] office  @ [name]: [phone]            01/23/2019, 6:08 PM

## 2019-01-23 NOTE — Progress Notes (Signed)
STROKE TEAM PROGRESS NOTE   INTERVAL HISTORY Pt lying in bed, comfortable. As per RN tech, he ate small amount of breakfast. Still on IVF. Na normalized however, Cre continues to elevate, nephrology consult requested. Neuro stable, no change.     Vitals:   01/22/19 1928 01/22/19 2328 01/23/19 0309 01/23/19 0906  BP: 128/84 129/77 (!) 136/97 (!) 141/86  Pulse: 89 84 82 76  Resp: 20 (!) 22 (!) 22 20  Temp: 98.4 F (36.9 C) 98.6 F (37 C) 98.7 F (37.1 C) 97.6 F (36.4 C)  TempSrc: Oral Oral Oral Oral  SpO2: 100% 100% 100% 98%  Weight:      Height:        CBC:  Recent Labs  Lab 01/22/19 0351 01/23/19 0451  WBC 6.6 8.7  HGB 11.2* 12.0*  HCT 35.4* 38.4*  MCV 108.6* 109.7*  PLT 1,174* 1,181*    Basic Metabolic Panel:  Recent Labs  Lab 01/22/19 0351 01/23/19 0451  NA 151* 145  K 3.7 3.5  CL 120* 117*  CO2 22 19*  GLUCOSE 126* 127*  BUN 49* 45*  CREATININE 1.75* 1.82*  CALCIUM 8.9 9.1   Lipid Panel:     Component Value Date/Time   CHOL 145 01/13/2019 0212   TRIG 97 01/13/2019 0212   HDL 39 (L) 01/13/2019 0212   CHOLHDL 3.7 01/13/2019 0212   VLDL 19 01/13/2019 0212   LDLCALC 87 01/13/2019 0212   HgbA1c:  Lab Results  Component Value Date   HGBA1C 5.1 01/13/2019   Urine Drug Screen:     Component Value Date/Time   LABOPIA NONE DETECTED 01/12/2019 0747   COCAINSCRNUR NONE DETECTED 01/12/2019 0747   LABBENZ NONE DETECTED 01/12/2019 0747   AMPHETMU NONE DETECTED 01/12/2019 0747   THCU NONE DETECTED 01/12/2019 0747   LABBARB NONE DETECTED 01/12/2019 0747    Alcohol Level     Component Value Date/Time   ETH <10 01/12/2019 0747   Urinalysis    Component Value Date/Time   COLORURINE AMBER (A) 01/22/2019 1844   APPEARANCEUR HAZY (A) 01/22/2019 1844   LABSPEC 1.017 01/22/2019 1844   PHURINE 5.0 01/22/2019 1844   GLUCOSEU NEGATIVE 01/22/2019 1844   HGBUR LARGE (A) 01/22/2019 1844   BILIRUBINUR NEGATIVE 01/22/2019 1844   KETONESUR NEGATIVE 01/22/2019  1844   PROTEINUR 30 (A) 01/22/2019 1844   NITRITE NEGATIVE 01/22/2019 1844   LEUKOCYTESUR TRACE (A) 01/22/2019 1844    IMAGING Ct Head Code Stroke Wo Contrast 01/12/2019 1. Equivocal for hyperdense left MCA. No hemorrhage or visible acute infarct.ASPECTS is 10. 2. Prominent atrophy and chronic small vessel ischemia.   Ct Code Stroke Cta Head W/wo Contrast Ct Code Stroke Cta Neck W/wo Contrast 01/12/2019 1. Left M1 embolus/occlusion with downstream reconstitution. There is 92 cc penumbra with no core infarct by CT perfusion. 2. Limited atheromatous changes.   Ct Code Stroke Cta Cerebral Perfusion W/wo Contrast 01/12/2019 1. Left M1 embolus/occlusion with downstream reconstitution. There is 92 cc penumbra with no core infarct by CT perfusion. 2. Limited atheromatous changes.   Cerebral Angiogram S/P lt common carotid arteriogram followed bt complete revascularization of occluded Lt MCA at origin with x 1 pass with 60mm x 40 mm solitaire x  Retriever  device achieving a TICI 3 revascularization.  Mr Brain Wo Contrast 01/13/2019 1. Patchy small volume acute ischemic nonhemorrhagic infarcts involving the left basal ganglia and subcortical left frontal lobe as above. 2. Underlying age-related cerebral atrophy with moderate chronic microvascular ischemic disease.  2D Echocardiogram  1. The left ventricle has hyperdynamic systolic function, with an ejection fraction of >65%. The cavity size was normal. There is mildly increased left ventricular wall thickness. Left ventricular diastolic Doppler parameters are indeterminate. No  evidence of left ventricular regional wall motion abnormalities.  2. The right ventricle has normal systolic function. The cavity was normal. There is no increase in right ventricular wall thickness.  3. Left atrial size was moderately dilated.  4. The pericardial effusion is localized near the right atrium.  5. Trivial pericardial effusion is present.  6. Tricuspid  valve regurgitation is moderate.  7. The aortic valve is tricuspid. Mild thickening of the aortic valve. Mild calcification of the aortic valve. Aortic valve regurgitation is mild by color flow Doppler.  8. The aorta is normal unless otherwise noted.  9. No intracardiac thrombi or masses were visualized.  Renal US 1. Mild bilateral hydronephrosis and proximal hydroureter which may be secondary to bladder distension. The patient was unable to void during the examination. 2. Right renal cysts.   PHYSICAL EXAM    Frail elderly Caucasian male not in distress.  He is hard of hearing.  Afebrile. Head is nontraumatic. Neck is supple without bruit.  Cardiac exam no murmur or gallop, however, irregularly irregular heart rate and rhythm. Lungs are clear to auscultation. Distal pulses are well felt. Neurological Exam ; Awake alert moderate to severe dysarthric but hardly to be understood.  Follows commands well.  Oriented to place and year and person, however not orientated to month.  Diminished attention, registration and recall.  Extraocular movements are full range without nystagmus.  Blinks to threat bilaterally.  Right lower facial weakness.  Tongue midline.  Symmetric upper and lower extremity strength.  Sensation is intact bilaterally.  Deep tendon flexes are symmetric.  Finger-to-nose intact bilaterally but slow action.  Plantars are downgoing.  Gait not tested.   ASSESSMENT/PLAN [name] is a 85 y.o. male with history of thrombocythemia, hyperlipidemia, hypertension, atrial fibrillation not on anticoagulation presenting with aphasia and R sided weakness.   Stroke:   L basal ganglia infarct with L M1 cut off s/p IR with TICI3 reperfusion- embolic secondary to known AF not on [facility]  Code Stroke CT head hyperdense L MCA. No acute abnormality.     CTA head & neck L M1 occlusion  CT perfusion 92cc penumbra w/ no core infarct  Cerebral angio L MCA occlusion at origin w/ TICI3  revascularization  MRI  Patchy small L basal ganglia and subcoritcal L frontal lobe infarcts.   2D Echo EF >65%. No source of embolus. LA mod dilated.   LDL 87  HgbA1c 5.1  Eliquis for VTE prophylaxis  aspirin 81 mg daily prior to admission, on eliquis now. Given Cre > 1.5, now on eliquis 2.5mg  bid. Consider increase to 5mg  once Cre < 1.5    therapy recommendations: SNF  Disposition:  pending  (caregiver for wife who has dementia). Have received insurance auth.   Repeat COVID test for d/c - neg   Microscopic hematuria on Eliquis  UA w/ 21-50 RBC 8/12  Repeat UA  > 50 RBC, many bact, 30 prot -> unchanged  UCx >100k ecoli   UA repeat pending  On Rocephin 8/26->  However, no gross hematuria  ?? Anemia   Hgb 12.8->7.4 over night - repeat afternoon 12.2 ->11.2->12  Likely lab error 8/26   UA showed RBC > 50 but no gross hematuria - repeat unchanged  Continue eliquis  2.5mg  bid  Atrial Fibrillation w/ RVR  Home anticoagulation:  none   Likely the cause of current stroke . added Eliquis for stroke prevention . Continue Eliquis on discharge . A. fib RVR resolved . On atenolol   Essential thrombocythemia  On hydroxyurea PTA  Jak 2 mutation positive  Platelet still quite high 604->660->798->905->1055->1542->1174->1181, likely reactive  Discontinue aspirin 81   Discussed with Dr. Alen Blew who felt it is reactive, no new intervention necessary  AKI Metabolic Acidoses  Cre [id number]  Nephrology on board   NS->D5->bicarb @ 100cc/h  Encourage po intake  BMP monitoring  Renal US mild B hydronephrosis and prox hydroureter. R renal cysts.    Pt hx of BPH -> ?? Foley catheter needed ??  Hypernatremia  Na 144->146->151->145   Change NS to D5 ->bicard now  BMP monitoring  Encourage po intake.   Urine Na pending    Hypertension  Home meds: Atenolol 50, enalapril 20 bid, HCTZ 12.5  Stable now  Resumed atenolol,  hold off enalapril and HCTZ due to elevated Cre  Long-term BP goal normotensive  Hyperlipidemia  Home meds:  pravachol 80  LDL 87, goal < 70  Resumed Pravachol 80  CK low at 22   Continue statin at discharge  Dysphagia . Secondary to stroke . D2 nectar thick liquid diet w/ MBSS  . on IVF @ 100/hr . Speech on board   Other Stroke Risk Factors  Advanced age  Hx smokeless tobacco user  Hx ETOH use  Other Active Problems  BPH on flomax  Agitation - likely sundowning on 8/18 requiring restraints and haldol - 8/19 resolved -on Seroquel hs prn   Hospital day # 11  I had long discussion with son over the phone, updated pt current condition, treatment plan and potential prognosis. They expressed understanding and appreciation.    Rosalin Hawking, MD PhD Stroke Neurology 01/23/2019 10:19 AM   To contact Stroke Continuity provider, please refer to [email or web address] After hours, contact General Neurology

## 2019-01-23 NOTE — Consult Note (Signed)
[place] Nurse wound consult note Reason for Consult: groin wound Wound type: puncture wound right groin  Pressure Injury POA: NA Measurement:0.1cm x 0.3cm x 0.1cm  Wound bed:100% yellow  Drainage (amount, consistency, odor) none Periwound: intact  Dressing procedure/placement/frequency: Xeroform gauze, dry dressing. Does not appear to be infected or larger than when last assessed 01/14/19  Discussed POC with patient and bedside nurse.  Re consult if needed, will not follow at this time. Thanks  Darwin Rothlisberger [employer], RN,CWOCN, CNS, [place] [phone])

## 2019-01-23 NOTE — Progress Notes (Signed)
Occupational Therapy Treatment Patient Details Name: Nicholas Contreras MRN: [id number] DOB: [birth year] Today's Date: 01/23/2019    History of present illness Patient is a 85 y/o male who presents with right sided weakness and aphasia. CTA- Left M1 occlusion s/p mechanical thrombectomy 8/17 achieving revascularization. Brain MRI-patchy infarcts left basal ganglia and subcortical left frontal lobe. PMH includes A-fib not on anticoagulation, HTN, HLD.   OT comments  Pt progressing with sitting EOB x20 mins for light feeding task. Pt able to feed self with minA initially to place food on spoon from table and then pt set-upA only when placed in lap. Pt sitting EOB with up to modA for trunk contrl apt for L lateral lean with cues to sit upright. Pt sit to stand with maxA and pt require bed pan immediately so pt returned to supine with rolling to R for bed pan placement- NT alerted. Pt continues to require OT skilled services. OT following acutely.   Follow Up Recommendations  SNF;Supervision/Assistance - 24 hour    Equipment Recommendations  Other (comment)(to be determined)    Recommendations for Other Services      Precautions / Restrictions Precautions Precautions: Fall Precaution Comments: monitor HR, L lean, left sided weakness.  Restrictions Weight Bearing Restrictions: No       Mobility Bed Mobility Overal bed mobility: Needs Assistance Bed Mobility: Supine to Sit;Sit to Supine     Supine to sit: Max assist;HOB elevated;+2 for physical assistance Sit to supine: Max assist;HOB elevated;+2 for physical assistance   General bed mobility comments: Max assist and multimodal cues used to get pt to intiate movement towards EOB.  Assist provided at trunk and legs.   Transfers Overall transfer level: Needs assistance Equipment used: Rolling walker (2 wheeled) Transfers: Sit to/from Stand Sit to Stand: Max assist;+2 physical assistance         General transfer comment: Pt's R knee  blocked and assist required to assist pt to full standing.    Balance Overall balance assessment: Needs assistance Sitting-balance support: Feet supported;Bilateral upper extremity supported Sitting balance-Leahy Scale: Poor Sitting balance - Comments: up to mod assist in sitting due to left lateral lean. Pt does well reaching to the end of the bed rail for stability in sitting .  Postural control: Left lateral lean Standing balance support: Bilateral upper extremity supported Standing balance-Leahy Scale: Zero Standing balance comment: maxA for sull standing                           ADL either performed or assessed with clinical judgement   ADL Overall ADL's : Needs assistance/impaired Eating/Feeding: Minimal assistance;Sitting;Cueing for sequencing Eating/Feeding Details (indicate cue type and reason): hand over hand assistance Grooming: Brushing hair;Minimal assistance;Sitting Grooming Details (indicate cue type and reason): able to perform without hand over hand with R portion of hair                             Functional mobility during ADLs: Maximal assistance;+2 for physical assistance;+2 for safety/equipment;Rolling walker General ADL Comments: Pt requiring bed pain for toileting needs. Pt limited by poor  mobility and strength with confusion     Vision       Perception     Praxis      Cognition Arousal/Alertness: Lethargic Behavior During Therapy: WFL for tasks assessed/performed Overall Cognitive Status: Impaired/Different from baseline Area of Impairment: Orientation;Attention;Following commands;Safety/judgement;Awareness;Problem solving  Orientation Level: Place Current Attention Level: Sustained Memory: Decreased short-term memory Following Commands: Follows one step commands with increased time Safety/Judgement: Decreased awareness of safety;Decreased awareness of deficits Awareness: Emergent Problem Solving:  Slow processing;Decreased initiation;Difficulty sequencing;Requires verbal cues;Requires tactile cues General Comments: Pt slow to process commands at times, needs help initiating and sequencing movement towards EOB.  Lethargic initially, but perked up when EOB.          Exercises     Shoulder Instructions       General Comments HR <115 BPM with activity    Pertinent Vitals/ Pain       Pain Assessment: No/denies pain  Home Living                                          Prior Functioning/Environment              Frequency  Min 2X/week        Progress Toward Goals  OT Goals(current goals can now be found in the care plan section)  Progress towards OT goals: Progressing toward goals  Acute Rehab OT Goals Patient Stated Goal: to get stronger again OT Goal Formulation: Patient unable to participate in goal setting Time For Goal Achievement: 02/06/19 Potential to Achieve Goals: Fair ADL Goals Pt Will Perform Grooming: with modified independence;sitting Pt Will Perform Upper Body Bathing: with modified independence;sitting Pt Will Perform Upper Body Dressing: with modified independence;sitting Pt/caregiver will Perform Home Exercise Program: Increased strength;Right Upper extremity;Independently;With written HEP provided Additional ADL Goal #1: Pt will demonstrate anticipatory awareness during functional task. Additional ADL Goal #2: Pt will maintain dynamic balance seated EOB at mod independent level during ADL task.  Plan Discharge plan remains appropriate    Co-evaluation    PT/OT/SLP Co-Evaluation/Treatment: Yes Reason for Co-Treatment: Complexity of the patient's impairments (multi-system involvement);To address functional/ADL transfers   OT goals addressed during session: ADL's and self-care      AM-PAC OT "6 Clicks" Daily Activity     Outcome Measure   Help from another person eating meals?: A Lot Help from another person taking care  of personal grooming?: A Lot Help from another person toileting, which includes using toliet, bedpan, or urinal?: Total Help from another person bathing (including washing, rinsing, drying)?: A Lot Help from another person to put on and taking off regular upper body clothing?: A Lot Help from another person to put on and taking off regular lower body clothing?: Total 6 Click Score: 10    End of Session Equipment Utilized During Treatment: Gait belt;Rolling walker  OT Visit Diagnosis: Muscle weakness (generalized) (M62.81);Other symptoms and signs involving the nervous system (R29.898)   Activity Tolerance Patient tolerated treatment well   Patient Left in bed;with call bell/phone within reach;with bed alarm set;Other (comment)   Nurse Communication Mobility status        Time: [phone] OT Time Calculation (min): 28 min  Charges: OT General Charges $OT Visit: 1 Visit OT Treatments $Self Care/Home Management : 8-22 mins  Darryl Nestle) Marsa Aris OTR/L Acute Rehabilitation Services Pager: [phone] Office: [phone]    Jenene Slicker Elyanah Farino 01/23/2019, 4:20 PM

## 2019-01-23 NOTE — Consult Note (Signed)
Referring Provider: No ref. provider found Primary Care Physician:  [place] Chard, MD Primary Nephrologist:     Reason for Consultation: Acute kidney injury, hypernatremia, hypertension and volume management,    HPI: This is a very pleasant 85 year old gentleman status post code stroke with a hyperdense left MCA lesion.  Underwent cerebral angiogram with revascularization of left MCA at origin.  Placement of 4 x 40 mm ultra and retrieval device.  TICI 3 revascularization.  Past medical history pertinent for hyperlipidemia hypertension and benign prostatic hypertrophy.  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  He has a history also of essential thrombocythemia.    Baseline creatinine appears to be about 0.95.  01/15/2019 this increased to 2.22 01/18/2019.  It briefly came down to 1.57 01/20/2019.  And then increased to 01/23/2019 to 1.82.  Urine output has been good although there has been some lability in terms of blood pressure control and hemodynamics.  Enalapril was discontinued 01/19/2019.  Home medications aspirin 81 mg atenolol 50 mg enalapril 20 mg twice daily Plendil 10 mg daily hydroxyurea 50 mg, pravastatin 80 mg daily Flomax 0.4 mg daily  He appears to have good urine output.  He underwent contrast-enhanced studies on 01/12/2019.  He also went percutaneous arterial thrombectomy on 01/19/2019 appreciate assistance from Dr. Lorraine Lax  Blood pressure 131/87 pulse 97 temperature 98.5 O2 sats 100% room air  Sodium 135 potassium 3.5 chloride 117 CO2 19 BUN 45 creatinine 1.82 glucose 127 calcium 9.1 WBC 8.7 hemoglobin 12 platelets 1181  2D echo was normal   Eliquis 2.5 mg twice daily atenolol 50 mg daily, hydroxyurea 500 mg daily, pravastatin 80 mg daily, Flomax 0.4 mg daily       Past Medical History:  Diagnosis Date  . BPH (benign prostatic hyperplasia)   . Essential thrombocythemia ([place])   . Hyperlipidemia   . Hypertension   . Osteoporosis     Past Surgical History:  Procedure  Laterality Date  . IR CT HEAD LTD  01/12/2019  . IR PERCUTANEOUS ART THROMBECTOMY/INFUSION INTRACRANIAL INC DIAG ANGIO  01/12/2019  . RADIOLOGY WITH ANESTHESIA N/A 01/12/2019   Procedure: IR WITH ANESTHESIA;  Surgeon: Radiologist, Medication, MD;  Location: [place];  Service: Radiology;  Laterality: N/A;  . SKIN CANCER EXCISION     was on head     Prior to Admission medications   Medication Sig Start Date End Date Taking? Authorizing Provider  aspirin 81 MG tablet Take 81 mg by mouth daily.   Yes [provider]  atenolol (TENORMIN) 50 MG tablet Take 50 mg by mouth daily.   Yes [provider]  enalapril (VASOTEC) 20 MG tablet TAKE 1 TABLET TWICE A DAY Patient taking differently: Take 20 mg by mouth 2 (two) times daily.  03/05/18  Yes Glendale Chard, MD  ergocalciferol (VITAMIN D2) 50000 UNITS capsule Take 50,000 Units by mouth once a week.   Yes [provider]  felodipine (PLENDIL) 10 MG 24 hr tablet TAKE 1 TABLET DAILY Patient taking differently: Take 10 mg by mouth daily.  06/16/18  Yes [place]ale Chard, MD  hydroxyurea (HYDREA) 500 MG capsule TAKE 1 CAPSULE DAILY WITH FOOD TO MINIMIZE GASTROINTESTINAL SIDE EFFECTS Patient taking differently: Take 500 mg by mouth daily. TAKE 1 CAPSULE DAILY WITH FOOD TO MINIMIZE GASTROINTESTINAL SIDE EFFECTS 06/04/18  Yes Shadad, Mathis Dad, MD  NON FORMULARY Take 1 tablet by mouth daily. Super Beta Prostate formula   Yes [provider]  pravastatin (PRAVACHOL) 80 MG tablet TAKE  1 TABLET DAILY Patient taking differently: Take 80 mg by mouth daily.  11/04/18  Yes [place]ale Chard, MD  Tamsulosin HCl (FLOMAX) 0.4 MG CAPS Take 0.4 mg by mouth daily.   Yes [provider]  hydrochlorothiazide (HYDRODIURIL) 12.5 MG tablet Take 12.5 mg by mouth daily.    [provider]    Current Facility-Administered Medications  Medication Dose Route Frequency Provider Last Rate Last Dose  .  stroke: mapping our early stages of  recovery book   Does not apply Once Donzetta Starch, NP      . acetaminophen (TYLENOL) tablet 650 mg  650 mg Oral Q4H PRN Donzetta Starch, NP   650 mg at 01/23/19 0022   Or  . acetaminophen (TYLENOL) solution 650 mg  650 mg Per Tube Q4H PRN Donzetta Starch, NP       Or  . acetaminophen (TYLENOL) suppository 650 mg  650 mg Rectal Q4H PRN Donzetta Starch, NP      . apixaban (ELIQUIS) tablet 2.5 mg  2.5 mg Oral BID Rosalin Hawking, MD   2.5 mg at 01/23/19 1010  . atenolol (TENORMIN) tablet 50 mg  50 mg Oral Daily Burnetta Sabin L, NP   50 mg at 01/23/19 1010  . bisacodyl (DULCOLAX) EC tablet 5-10 mg  5-10 mg Oral Daily PRN Rosalin Hawking, MD   10 mg at 01/18/19 2123  . cefTRIAXone (ROCEPHIN) 1 g in sodium chloride 0.9 % 100 mL IVPB  1 g Intravenous Q24H Rosalin Hawking, MD 200 mL/hr at 01/23/19 1017 1 g at 01/23/19 1017  . dextrose 5 % solution   Intravenous Continuous Rosalin Hawking, MD 75 mL/hr at 01/23/19 1018    . hydrocerin (EUCERIN) cream 1 application  1 application Topical BID PRN Greta Doom, MD   1 application at [id number]  . hydroxyurea (HYDREA) capsule 500 mg  500 mg Oral Daily Burnetta Sabin L, NP   500 mg at 01/23/19 1009  . metoprolol tartrate (LOPRESSOR) injection 2.5 mg  2.5 mg Intravenous Q6H PRN Donzetta Starch, NP   2.5 mg at 01/15/19 0835  . pravastatin (PRAVACHOL) tablet 80 mg  80 mg Oral Daily Burnetta Sabin L, NP   80 mg at 01/23/19 1009  . QUEtiapine (SEROQUEL) tablet 12.5 mg  12.5 mg Oral QHS PRN Burnetta Sabin L, NP   12.5 mg at 01/23/19 0023  . Resource ThickenUp Clear   Oral PRN Garvin Fila, MD      . tamsulosin [facility]) capsule 0.4 mg  0.4 mg Oral Daily Burnetta Sabin L, NP   0.4 mg at 01/23/19 1009    Allergies as of 01/12/2019 - Review Complete 01/12/2019  Allergen Reaction Noted  . Codeine  01/12/2019    History reviewed. No pertinent family history.  Social History   Socioeconomic History  . Marital status: Married    Spouse name: Not on file  . Number of  children: Not on file  . Years of education: Not on file  . Highest education level: Not on file  Occupational History  . Not on file  Social Needs  . Financial resource strain: Not hard at all  . Food insecurity    Worry: Never true    Inability: Never true  . Transportation needs    Medical: No    Non-medical: No  Tobacco Use  . Smoking status: Never Smoker  . Smokeless tobacco: Former Network engineer and Sexual Activity  . Alcohol use: Not  Currently  . Drug use: Never  . Sexual activity: Not Currently  Lifestyle  . Physical activity    Days per week: 0 days    Minutes per session: 0 min  . Stress: Not at all  Relationships  . Social connections    Talks on phone: More than three times a week    Gets together: Once a week    Attends religious service: 1 to 4 times per year    Active member of club or organization: Yes    Attends meetings of clubs or organizations: 1 to 4 times per year    Relationship status: Patient refused  . Intimate partner violence    Fear of current or ex partner: No    Emotionally abused: No    Physically abused: No    Forced sexual activity: No  Other Topics Concern  . Not on file  Social History Narrative  . Not on file    Review of Systems: Patient dysarthric.  Review of systems was essentially negative  Physical Exam: Vital signs in last 24 hours: Temp:  [97.6 F (36.4 C)-98.7 F (37.1 C)] 98.5 F (36.9 C) (08/28 1100) Pulse Rate:  [76-97] 97 (08/28 1100) Resp:  [20-22] 20 (08/28 1100) BP: (125-141)/(77-97) 131/87 (08/28 1100) SpO2:  [98 %-100 %] 100 % (08/28 1100) Last BM Date: (unknown) General:   Alert,  Well-developed, well-nourished, pleasant and cooperative in NAD Head:  Normocephalic and atraumatic. Eyes:  Sclera clear, no icterus.   Conjunctiva pink. Ears:  Normal auditory acuity. Nose:  No deformity, discharge,  or lesions. Mouth:  No deformity or lesions, dentition normal. Neck:  Supple; no masses or thyromegaly.  JVP not elevated Lungs:  Clear throughout to auscultation.   No wheezes, crackles, or rhonchi. No acute distress. Heart:  Regular rate and rhythm; no murmurs, clicks, rubs,  or gallops. Abdomen:  Soft, nontender and nondistended. No masses, hepatosplenomegaly or hernias noted. Normal bowel sounds, without guarding, and without rebound.   Msk:  Symmetrical without gross deformities. Normal posture. Pulses:  No carotid, renal, femoral bruits. DP and PT symmetrical and equal Extremities:  Without clubbing or edema. Neurologic:  Alert and  oriented x4;  grossly normal neurologically. Skin:  Intact without significant lesions or rashes.   Intake/Output from previous day: 08/27 0701 - 08/28 0700 In: 625 [P.O.:300; I.V.:225; IV Piggyback:100] Out: -  Intake/Output this shift: No intake/output data recorded.  Lab Results: Recent Labs    01/21/19 0355 01/21/19 1408 01/22/19 0351 01/23/19 0451  WBC 12.3*  --  6.6 8.7  HGB 7.4* 12.2* 11.2* 12.0*  HCT 22.2* 37.4* 35.4* 38.4*  PLT 1,542*  --  1,174* 1,181*   BMET Recent Labs    01/21/19 0355 01/22/19 0351 01/23/19 0451  NA 146* 151* 145  K 3.9 3.7 3.5  CL 118* 120* 117*  CO2 18* 22 19*  GLUCOSE 113* 126* 127*  BUN 48* 49* 45*  CREATININE 1.58* 1.75* 1.82*  CALCIUM 9.1 8.9 9.1   LFT No results for input(s): PROT, ALBUMIN, AST, ALT, ALKPHOS, BILITOT, BILIDIR, IBILI in the last 72 hours. PT/INR No results for input(s): LABPROT, INR in the last 72 hours. Hepatitis Panel No results for input(s): HEPBSAG, HCVAB, HEPAIGM, HEPBIGM in the last 72 hours.  Studies/Results: No results found.  Assessment/Plan:  Acute kidney injury with normal baseline.  Will check urinalysis renal ultrasound to rule out obstruction.  It does appear that he received IV contrast studies 01/12/2019 01/19/2019.  There is also  the administration of ACE inhibitor enalapril up until 01/19/2019 when it was discontinued.  There is also some hemodynamic variability  throughout his hospitalization most recently 01/17/2019 with lower blood pressures.  All of this could contribute to his acute kidney injury they do not appear to be any other inciting factors or antibiotic use.   Hypertension/volume have discontinued antihypertensive medications  Hyperlipidemia patient taking statins will check CPK  Anemia stable no issue at this point  Metabolic acidosis we will change IV fluids to IV bicarbonate.  History of CVA status post arthrectomy 01/19/2019   LOS: 11 Sherril Croon @TODAY @11 :05 AM

## 2019-01-24 LAB — CBC
HCT: 33.7 % — ABNORMAL LOW (ref 39.0–52.0)
Hemoglobin: 11 g/dL — ABNORMAL LOW (ref 13.0–17.0)
MCH: 34.3 pg — ABNORMAL HIGH (ref 26.0–34.0)
MCHC: 32.6 g/dL (ref 30.0–36.0)
MCV: 105 fL — ABNORMAL HIGH (ref 80.0–100.0)
Platelets: 1106 10*3/uL (ref 150–400)
RBC: 3.21 MIL/uL — ABNORMAL LOW (ref 4.22–5.81)
RDW: 13.6 % (ref 11.5–15.5)
WBC: 8.8 10*3/uL (ref 4.0–10.5)
nRBC: 0 % (ref 0.0–0.2)

## 2019-01-24 LAB — BASIC METABOLIC PANEL
Anion gap: 10 (ref 5–15)
BUN: 42 mg/dL — ABNORMAL HIGH (ref 8–23)
CO2: 24 mmol/L (ref 22–32)
Calcium: 8.6 mg/dL — ABNORMAL LOW (ref 8.9–10.3)
Chloride: 109 mmol/L (ref 98–111)
Creatinine, Ser: 1.97 mg/dL — ABNORMAL HIGH (ref 0.61–1.24)
GFR calc Af Amer: 34 mL/min — ABNORMAL LOW (ref >60)
GFR calc non Af Amer: 29 mL/min — ABNORMAL LOW (ref >60)
Glucose, Bld: 125 mg/dL — ABNORMAL HIGH (ref 70–99)
Potassium: 3.2 mmol/L — ABNORMAL LOW (ref 3.5–5.1)
Sodium: 143 mmol/L (ref 135–145)

## 2019-01-24 LAB — GLUCOSE, CAPILLARY
Glucose-Capillary: 127 mg/dL — ABNORMAL HIGH (ref 70–99)
Glucose-Capillary: 108 mg/dL — ABNORMAL HIGH (ref 70–99)
Glucose-Capillary: 105 mg/dL — ABNORMAL HIGH (ref 70–99)
Glucose-Capillary: 133 mg/dL — ABNORMAL HIGH (ref 70–99)
Glucose-Capillary: 118 mg/dL — ABNORMAL HIGH (ref 70–99)

## 2019-01-24 MED ORDER — POTASSIUM CHLORIDE 10 MEQ/50ML IV SOLN
10.0000 meq | INTRAVENOUS | Status: AC
  Administered 2019-01-24 (×3): 10 meq via INTRAVENOUS
  Filled 2019-01-24 (×3): qty 50

## 2019-01-24 NOTE — Progress Notes (Signed)
STROKE TEAM PROGRESS NOTE   INTERVAL HISTORY  Neuro stable, no change.     Vitals:   01/23/19 2326 01/24/19 0418 01/24/19 0704 01/24/19 1100  BP: 120/77 (!) 136/99 140/89 123/86  Pulse: 100 95 86 82  Resp:  20 (!) 21 14  Temp: 98.7 F (37.1 C) 98.8 F (37.1 C) 98.1 F (36.7 C) 98.1 F (36.7 C)  TempSrc: Axillary Axillary Axillary Axillary  SpO2: 94% 99% 99% 98%  Weight:      Height:        CBC:  Recent Labs  Lab 01/23/19 0451 01/24/19 0533  WBC 8.7 8.8  HGB 12.0* 11.0*  HCT 38.4* 33.7*  MCV 109.7* 105.0*  PLT 1,181* 1,106*    Basic Metabolic Panel:  Recent Labs  Lab 01/23/19 0451 01/24/19 0533  NA 145 143  K 3.5 3.2*  CL 117* 109  CO2 19* 24  GLUCOSE 127* 125*  BUN 45* 42*  CREATININE 1.82* 1.97*  CALCIUM 9.1 8.6*   Lipid Panel:     Component Value Date/Time   CHOL 145 01/13/2019 0212   TRIG 97 01/13/2019 0212   HDL 39 (L) 01/13/2019 0212   CHOLHDL 3.7 01/13/2019 0212   VLDL 19 01/13/2019 0212   LDLCALC 87 01/13/2019 0212   HgbA1c:  Lab Results  Component Value Date   HGBA1C 5.1 01/13/2019   Urine Drug Screen:     Component Value Date/Time   LABOPIA NONE DETECTED 01/12/2019 0747   COCAINSCRNUR NONE DETECTED 01/12/2019 0747   LABBENZ NONE DETECTED 01/12/2019 0747   AMPHETMU NONE DETECTED 01/12/2019 0747   THCU NONE DETECTED 01/12/2019 0747   LABBARB NONE DETECTED 01/12/2019 0747    Alcohol Level     Component Value Date/Time   ETH <10 01/12/2019 0747   Urinalysis    Component Value Date/Time   COLORURINE AMBER (A) 01/23/2019 1800   APPEARANCEUR HAZY (A) 01/23/2019 1800   LABSPEC 1.015 01/23/2019 1800   PHURINE 5.0 01/23/2019 1800   GLUCOSEU NEGATIVE 01/23/2019 1800   HGBUR LARGE (A) 01/23/2019 1800   BILIRUBINUR NEGATIVE 01/23/2019 1800   KETONESUR NEGATIVE 01/23/2019 1800   PROTEINUR 30 (A) 01/23/2019 1800   NITRITE NEGATIVE 01/23/2019 1800   LEUKOCYTESUR NEGATIVE 01/23/2019 1800    IMAGING  Ct Head Code Stroke Wo  Contrast 01/12/2019 1. Equivocal for hyperdense left MCA. No hemorrhage or visible acute infarct.ASPECTS is 10.  2. Prominent atrophy and chronic small vessel ischemia.   Ct Code Stroke Cta Head W/wo Contrast Ct Code Stroke Cta Neck W/wo Contrast 01/12/2019 1. Left M1 embolus/occlusion with downstream reconstitution. There is 92 cc penumbra with no core infarct by CT perfusion.  2. Limited atheromatous changes.   Ct Code Stroke Cta Cerebral Perfusion W/wo Contrast 01/12/2019 1. Left M1 embolus/occlusion with downstream reconstitution. There is 92 cc penumbra with no core infarct by CT perfusion.  2. Limited atheromatous changes.   Cerebral Angiogram S/P lt common carotid arteriogram followed bt complete revascularization of occluded Lt MCA at origin with x 1 pass with 35mm x 40 mm solitaire x  Retriever  device achieving a TICI 3 revascularization.  Mr Brain Wo Contrast 01/13/2019 1. Patchy small volume acute ischemic nonhemorrhagic infarcts involving the left basal ganglia and subcortical left frontal lobe as above.  2. Underlying age-related cerebral atrophy with moderate chronic microvascular ischemic disease.   2D Echocardiogram  1. The left ventricle has hyperdynamic systolic function, with an ejection fraction of >65%. The cavity size was normal. There is mildly  increased left ventricular wall thickness. Left ventricular diastolic Doppler parameters are indeterminate. No  evidence of left ventricular regional wall motion abnormalities.  2. The right ventricle has normal systolic function. The cavity was normal. There is no increase in right ventricular wall thickness.  3. Left atrial size was moderately dilated.  4. The pericardial effusion is localized near the right atrium.  5. Trivial pericardial effusion is present.  6. Tricuspid valve regurgitation is moderate.  7. The aortic valve is tricuspid. Mild thickening of the aortic valve. Mild calcification of the aortic valve. Aortic  valve regurgitation is mild by color flow Doppler.  8. The aorta is normal unless otherwise noted.  9. No intracardiac thrombi or masses were visualized.  Renal US 1. Mild bilateral hydronephrosis and proximal hydroureter which may be secondary to bladder distension. The patient was unable to void during the examination. 2. Right renal cysts.   PHYSICAL EXAM    Frail elderly Caucasian male not in distress.  He is hard of hearing.  Afebrile. Head is nontraumatic. Neck is supple without bruit.  Cardiac exam no murmur or gallop, however, irregularly irregular heart rate and rhythm. Lungs are clear to auscultation. Distal pulses are well felt. Neurological Exam ; Awake alert moderate to severe dysarthric but hardly to be understood.  Follows commands well.  Oriented to place and year and person, however not orientated to month.  Diminished attention, registration and recall.  Extraocular movements are full range without nystagmus.  Blinks to threat bilaterally.  Right lower facial weakness.  Tongue midline.  Symmetric upper and lower extremity strength.  Sensation is intact bilaterally.  Deep tendon flexes are symmetric.  Finger-to-nose intact bilaterally but slow action.  Plantars are downgoing.  Gait not tested.   ASSESSMENT/PLAN [name] is a 85 y.o. male with history of thrombocythemia, hyperlipidemia, hypertension, atrial fibrillation not on anticoagulation presenting with aphasia and R sided weakness.   Stroke:   L basal ganglia infarct with L M1 cut off s/p IR with TICI3 reperfusion- embolic secondary to known AF not on [facility]  Code Stroke CT head hyperdense L MCA. No acute abnormality.     CTA head & neck L M1 occlusion  CT perfusion 92cc penumbra w/ no core infarct  Cerebral angio L MCA occlusion at origin w/ TICI3 revascularization  MRI  Patchy small L basal ganglia and subcoritcal L frontal lobe infarcts.   2D Echo EF >65%. No source of embolus. LA mod dilated.   LDL  87  HgbA1c 5.1  Eliquis for VTE prophylaxis  aspirin 81 mg daily prior to admission, on eliquis now. Given Cre > 1.5, now on eliquis 2.5mg  bid. Consider increase to 5mg  once Cre < 1.5  (1.97)  therapy recommendations: SNF  Disposition:  pending  (caregiver for wife who has dementia). Have received insurance auth.   Repeat COVID test for d/c - neg   Microscopic hematuria on Eliquis  UA w/ 21-50 RBC 8/12  Repeat UA  > 50 RBC, many bact, 30 prot -> unchanged  UCx 01/20/19 >100k ecoli   UA repeat -> unchanged  On Rocephin 8/26 -> 8/29  However, no gross hematuria  ?? Anemia   Hgb 12.8->7.4 over night - repeat afternoon 12.2 ->11.2->12  Likely lab error 8/26   UA showed RBC > 50 but no gross hematuria - repeat unchanged  Continue eliquis 2.5mg  bid  Atrial Fibrillation w/ RVR  Home anticoagulation:  none   Likely the cause of current stroke .  added Eliquis for stroke prevention . Continue Eliquis on discharge . A. fib RVR resolved . On atenolol   Essential thrombocythemia  On hydroxyurea PTA  Jak 2 mutation positive  Platelet still quite high 604->660->798->905->1055->1542->1174->1181->1106, likely reactive  Discontinue aspirin 81   Discussed with Dr. Alen Blew who felt it is reactive, no new intervention necessary  AKI Metabolic Acidoses  Cre 0000000  Nephrology on board   NS->D5->bicarb @ 100cc/h  Encourage po intake  BMP monitoring  Renal US mild B hydronephrosis and prox hydroureter. R renal cysts.    Pt hx of BPH -> Nephrology to place foley  UTI treated with IV Rocephin  Metabolic acidosis resolved stopped Iv bicarb  Hypernatremia  Na 144->146->151->145->143  Change NS to D5 ->bicard now  BMP monitoring  Encourage po intake.   Urine Na - pending    Hypertension  Home meds: Atenolol 50, enalapril 20 bid, HCTZ 12.5  Stable now  Resumed atenolol, hold off enalapril and HCTZ due to  elevated Cre  Long-term BP goal normotensive  Hyperlipidemia  Home meds:  pravachol 80  LDL 87, goal < 70  Resumed Pravachol 80  CK low at 22   Continue statin at discharge  Dysphagia . Secondary to stroke . D2 nectar thick liquid diet w/ MBSS  . on IVF @ 100/hr . Speech on board   Other Stroke Risk Factors  Advanced age  Hx smokeless tobacco user  Hx ETOH use  Other Active Problems  BPH on flomax  Agitation - likely sundowning on 8/18 requiring restraints and haldol - 8/19 resolved -on Seroquel hs prn   Hypokalemia - 3.2 - supplemented - recheck in morning  Hospital day # 12  Dr. Erlinda Hong had long discussion with son over the phone, updated pt current condition, treatment plan and potential prognosis. They expressed understanding and appreciation.   Personally examined patient and images, and have participated in and made any corrections needed to history, physical, neuro exam,assessment and plan as stated above.  I have personally obtained the history, evaluated lab date, reviewed imaging studies and agree with radiology interpretations.    Sarina Ill, MD Stroke Neurology   A total of 25 minutes was spent for the care of this patient, spent on counseling patient and family on different diagnostic and therapeutic options, counseling and coordination of care, riskd ans benefits of management, compliance, or risk factor reduction and education.      To contact Stroke Continuity provider, please refer to [email or web address] After hours, contact General Neurology

## 2019-01-24 NOTE — Progress Notes (Addendum)
[facility] ROUNDING NOTE   Subjective:   This is a very pleasant 85 year old gentleman status post code stroke with a hyperdense left MCA lesion.  Underwent cerebral angiogram with revascularization of left MCA at origin.  Placement of 4 x 40 mm ultra and retrieval device.  TICI 3 revascularization.  Past medical history pertinent for hyperlipidemia hypertension and benign prostatic hypertrophy.  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  He has a history also of essential thrombocythemia.    Baseline serum creatinine 0.95 on 01/15/2019.  There is been some lability of blood pressure.  He was on enalapril which was discontinued 01/19/2019.  We are consulted for acute kidney injury with a peak creatinine of 1.82.  He did receive IV contrast 01/12/2019 and 01/19/2019.  Blood pressure 140/89 pulse 88 temperature 98.1 O2 sats 99% room air  Sodium 143 potassium 3.2 chloride 109 CO2 25 BUN 42 creatinine 1.97 glucose 125 calcium 8.6 WBC is 8.8 hemoglobin 11.0 platelets 1106.  No recorded ins and outs on 01/23/2019  Atenolol 50 mg daily, hydroxyurea 500 mg daily, pravastatin 80 mg daily, Flomax 0.4 mg daily, apixaban 2.5 mg daily  IV Rocephin 1 g every 24 hours  IV sodium bicarbonate 100 cc an hour   Objective:  Vital signs in last 24 hours:  Temp:  [97.6 F (36.4 C)-98.9 F (37.2 C)] 98.1 F (36.7 C) (08/29 0704) Pulse Rate:  [76-100] 86 (08/29 0704) Resp:  [16-27] 21 (08/29 0704) BP: (103-141)/(69-99) 140/89 (08/29 0704) SpO2:  [94 %-100 %] 99 % (08/29 0704)  Weight change:  Filed Weights   01/12/19 0815  Weight: 79.6 kg    Intake/Output: I/O last 3 completed shifts: In: 2849.5 [P.O.:120; I.V.:2729.5] Out: -    Intake/Output this shift:  No intake/output data recorded. Alert pleasant gentleman nondistressed CVS- RRR no audible murmurs rubs or gallops RS- clear to auscultation with no wheezes or rales ABD- BS present soft non-distended EXT- no edema   Basic  Metabolic Panel: Recent Labs  Lab 01/20/19 0346 01/21/19 0355 01/22/19 0351 01/23/19 0451 01/24/19 0533  NA 144 146* 151* 145 143  K 3.5 3.9 3.7 3.5 3.2*  CL 115* 118* 120* 117* 109  CO2 21* 18* 22 19* 24  GLUCOSE 113* 113* 126* 127* 125*  BUN 51* 48* 49* 45* 42*  CREATININE 1.57* 1.58* 1.75* 1.82* 1.97*  CALCIUM 9.2 9.1 8.9 9.1 8.6*    Liver Function Tests: No results for input(s): AST, ALT, ALKPHOS, BILITOT, PROT, ALBUMIN in the last 168 hours. No results for input(s): LIPASE, AMYLASE in the last 168 hours. No results for input(s): AMMONIA in the last 168 hours.  CBC: Recent Labs  Lab 01/20/19 0346 01/21/19 0355 01/21/19 1408 01/22/19 0351 01/23/19 0451 01/24/19 0533  WBC 12.5* 12.3*  --  6.6 8.7 8.8  HGB 12.8* 7.4* 12.2* 11.2* 12.0* 11.0*  HCT 39.4 22.2* 37.4* 35.4* 38.4* 33.7*  MCV 106.5* 104.7*  --  108.6* 109.7* 105.0*  PLT 1,055* 1,542*  --  1,174* 1,181* 1,106*    Cardiac Enzymes: Recent Labs  Lab 01/23/19 1134  CKTOTAL 22*    BNP: Invalid input(s): POCBNP  CBG: Recent Labs  Lab 01/23/19 0601 01/23/19 1102 01/23/19 1626 01/23/19 2106 01/24/19 0609  GLUCAP 110* 105* 125* 113* 127*    Microbiology: Results for orders placed or performed during the hospital encounter of 01/12/19  SARS Coronavirus 2 [facility] order, Performed in [facility] hospital lab) Nasopharyngeal Nasopharyngeal Swab     Status:  None   Collection Time: 01/12/19  7:52 AM   Specimen: Nasopharyngeal Swab  Result Value Ref Range Status   SARS Coronavirus 2 NEGATIVE NEGATIVE Final    Comment: (NOTE) If result is NEGATIVE SARS-CoV-2 target nucleic acids are NOT DETECTED. The SARS-CoV-2 RNA is generally detectable in upper and lower  respiratory specimens during the acute phase of infection. The lowest  concentration of SARS-CoV-2 viral copies this assay can detect is 250  copies / mL. A negative result does not preclude SARS-CoV-2 infection  and should not be used as the  sole basis for treatment or other  patient management decisions.  A negative result may occur with  improper specimen collection / handling, submission of specimen other  than nasopharyngeal swab, presence of viral mutation(s) within the  areas targeted by this assay, and inadequate number of viral copies  (<250 copies / mL). A negative result must be combined with clinical  observations, patient history, and epidemiological information. If result is POSITIVE SARS-CoV-2 target nucleic acids are DETECTED. The SARS-CoV-2 RNA is generally detectable in upper and lower  respiratory specimens dur ing the acute phase of infection.  Positive  results are indicative of active infection with SARS-CoV-2.  Clinical  correlation with patient history and other diagnostic information is  necessary to determine patient infection status.  Positive results do  not rule out bacterial infection or co-infection with other viruses. If result is PRESUMPTIVE POSTIVE SARS-CoV-2 nucleic acids MAY BE PRESENT.   A presumptive positive result was obtained on the submitted specimen  and confirmed on repeat testing.  While 2019 novel coronavirus  (SARS-CoV-2) nucleic acids may be present in the submitted sample  additional confirmatory testing may be necessary for epidemiological  and / or clinical management purposes  to differentiate between  SARS-CoV-2 and other Sarbecovirus currently known to infect humans.  If clinically indicated additional testing with an alternate test  methodology [phone]) is advised. The SARS-CoV-2 RNA is generally  detectable in upper and lower respiratory sp ecimens during the acute  phase of infection. The expected result is Negative. Fact Sheet for Patients:  StrictlyIdeas.no Fact Sheet for Healthcare Providers: BankingDealers.co.za This test is not yet approved or cleared by the [place] FDA and has been authorized for detection  and/or diagnosis of SARS-CoV-2 by FDA under an Emergency Use Authorization (EUA).  This EUA will remain in effect (meaning this test can be used) for the duration of the COVID-19 declaration under Section 564(b)(1) of the Act, 21 U.S.C. section 360bbb-3(b)(1), unless the authorization is terminated or revoked sooner. Performed at [facility], [place] [address] [place], [place] [address]   MRSA PCR Screening     Status: None   Collection Time: 01/12/19 11:27 AM   Specimen: Nasal Mucosa; Nasopharyngeal  Result Value Ref Range Status   MRSA by PCR NEGATIVE NEGATIVE Final    Comment:        The GeneXpert MRSA Assay (FDA approved for NASAL specimens only), is one component of a comprehensive MRSA colonization surveillance program. It is not intended to diagnose MRSA infection nor to guide or monitor treatment for MRSA infections. Performed at [facility], [place] [address], [place], [place] [address]   Novel Coronavirus, NAA (hospital order; send-out to ref lab)     Status: None   Collection Time: 01/19/19 12:24 AM   Specimen: Nasopharyngeal Swab; Respiratory  Result Value Ref Range Status   SARS-CoV-2, NAA NOT DETECTED NOT DETECTED Final  Comment: (NOTE) This test was developed and its performance characteristics determined by [employer]. This test has not been FDA cleared or approved. This test has been authorized by FDA under an Emergency Use Authorization (EUA). This test is only authorized for the duration of time the declaration that circumstances exist justifying the authorization of the emergency use of in vitro diagnostic tests for detection of SARS-CoV-2 virus and/or diagnosis of COVID-19 infection under section 564(b)(1) of the Act, 21 U.S.C. KA:123727), unless the authorization is terminated or revoked sooner. When diagnostic testing is negative, the possibility of a false negative result should be considered in the context of a patient's  recent exposures and the presence of clinical signs and symptoms consistent with COVID-19. An individual without symptoms of COVID-19 and who is not shedding SARS-CoV-2 virus would expect to have a negative (not detected) result in this assay. Performed  At: [facility] [address] [place], [place] [id number] Rush Farmer MD [id number]    [place]  Final    Comment: Performed at [facility], [place] [address], [place], [place] [address]  Urine Culture     Status: Abnormal (Preliminary result)   Collection Time: 01/20/19  2:49 PM   Specimen: Urine, Random  Result Value Ref Range Status   Specimen Description URINE, RANDOM  Final   Special Requests NONE  Final   Culture (A)  Final    >=100,000 COLONIES/mL ESCHERICHIA COLI CULTURE REINCUBATED FOR BETTER GROWTH Performed at [facility], [place] [address] [place], [place] [address]    Report Status PENDING  Incomplete    Coagulation Studies: No results for input(s): LABPROT, INR in the last 72 hours.  Urinalysis: Recent Labs    01/22/19 1844 01/23/19 1800  COLORURINE AMBER* AMBER*  LABSPEC 1.017 1.015  PHURINE 5.0 5.0  GLUCOSEU NEGATIVE NEGATIVE  HGBUR LARGE* LARGE*  BILIRUBINUR NEGATIVE NEGATIVE  KETONESUR NEGATIVE NEGATIVE  PROTEINUR 30* 30*  NITRITE NEGATIVE NEGATIVE  LEUKOCYTESUR TRACE* NEGATIVE      Imaging: US Renal  Result Date: 01/23/2019 CLINICAL DATA:  Acute renal failure. EXAM: RENAL / URINARY TRACT ULTRASOUND COMPLETE COMPARISON:  None. FINDINGS: Right Kidney: Renal measurements: 12.3 x 5.0 x 5.4 cm = volume: 173 mL . Echogenicity within normal limits. 2.7 cm and 0.7 cm cysts. Mild hydronephrosis and proximal hydroureter. Left Kidney: Renal measurements: 12.8 x 5.7 x 5.4 cm = volume: 208 mL. Echogenicity within normal limits. No mass identified with assessment somewhat limited by bowel gas. Mild hydronephrosis and proximal hydroureter. Bladder: Moderately  distended and otherwise unremarkable. The patient was unable to void. Ureteral jets were not visualized during the examination. IMPRESSION: 1. Mild bilateral hydronephrosis and proximal hydroureter which may be secondary to bladder distension. The patient was unable to void during the examination. 2. Right renal cysts. Electronically Signed   By: Logan Bores M.D.   On: 01/23/2019 12:50     Medications:   . cefTRIAXone (ROCEPHIN)  IV 1 g (01/23/19 1017)  . dextrose 75 mL/hr at 01/24/19 0420  .  sodium bicarbonate (isotonic) infusion in sterile water 100 mL/hr at 01/23/19 2318   .  stroke: mapping our early stages of recovery book   Does not apply Once  . apixaban  2.5 mg Oral BID  . atenolol  50 mg Oral Daily  . hydroxyurea  500 mg Oral Daily  . pravastatin  80 mg Oral Daily  . tamsulosin  0.4 mg Oral Daily   acetaminophen **OR** acetaminophen (TYLENOL)  oral liquid 160 mg/5 mL **OR** acetaminophen, bisacodyl, hydrocerin, metoprolol tartrate, QUEtiapine, Resource ThickenUp Clear  Assessment/ Plan:   Acute kidney injury with bilateral hydronephrosis noted on ultrasound.  Will have Foley catheter placed.  He did have also have some blood pressure instability as well as use of IV contrast and ACE inhibitor enalapril.  Creatinine continues to rise 1.9 this morning  Hypokalemia we will replete with IV potassium  Anemia stable not an issue at this time  Essential thrombocythemia continue hydroxyurea  History of CVA left basal ganglia infarct with LM1 status post IR with TICI 3 reperfusion  History of atrial fibrillation Eliquis  Urinalysis E. coli UTI 01/20/2019 treated with IV Rocephin  Hypertension volume continue to avoid ACE inhibitors and ARB's in setting of acute kidney injury blood pressure appears under good control  Metabolic acidosis resolved will discontinue IV bicarbonate  LOS: [place] @TODAY @8 :49 AM

## 2019-01-25 LAB — URINE CULTURE: Culture: >=100000 — AB

## 2019-01-25 LAB — BASIC METABOLIC PANEL
Anion gap: 9 (ref 5–15)
BUN: 33 mg/dL — ABNORMAL HIGH (ref 8–23)
CO2: 25 mmol/L (ref 22–32)
Calcium: 8.3 mg/dL — ABNORMAL LOW (ref 8.9–10.3)
Chloride: 108 mmol/L (ref 98–111)
Creatinine, Ser: 1.37 mg/dL — ABNORMAL HIGH (ref 0.61–1.24)
GFR calc Af Amer: 53 mL/min — ABNORMAL LOW (ref >60)
GFR calc non Af Amer: 46 mL/min — ABNORMAL LOW (ref >60)
Glucose, Bld: 114 mg/dL — ABNORMAL HIGH (ref 70–99)
Potassium: 3.2 mmol/L — ABNORMAL LOW (ref 3.5–5.1)
Sodium: 142 mmol/L (ref 135–145)

## 2019-01-25 LAB — CBC
HCT: 32.3 % — ABNORMAL LOW (ref 39.0–52.0)
Hemoglobin: 10.2 g/dL — ABNORMAL LOW (ref 13.0–17.0)
MCH: 33.7 pg (ref 26.0–34.0)
MCHC: 31.6 g/dL (ref 30.0–36.0)
MCV: 106.6 fL — ABNORMAL HIGH (ref 80.0–100.0)
Platelets: 951 10*3/uL (ref 150–400)
RBC: 3.03 MIL/uL — ABNORMAL LOW (ref 4.22–5.81)
RDW: 13.6 % (ref 11.5–15.5)
WBC: 6.3 10*3/uL (ref 4.0–10.5)
nRBC: 0 % (ref 0.0–0.2)

## 2019-01-25 LAB — GLUCOSE, CAPILLARY
Glucose-Capillary: 116 mg/dL — ABNORMAL HIGH (ref 70–99)
Glucose-Capillary: 115 mg/dL — ABNORMAL HIGH (ref 70–99)
Glucose-Capillary: 116 mg/dL — ABNORMAL HIGH (ref 70–99)
Glucose-Capillary: 142 mg/dL — ABNORMAL HIGH (ref 70–99)

## 2019-01-25 MED ORDER — POTASSIUM CHLORIDE 10 MEQ/100ML IV SOLN
10.0000 meq | INTRAVENOUS | Status: AC
  Administered 2019-01-25 (×3): 10 meq via INTRAVENOUS
  Filled 2019-01-25 (×3): qty 100

## 2019-01-25 NOTE — Progress Notes (Signed)
[facility] ROUNDING NOTE   Subjective:   This is a very pleasant 85 year old gentleman status post code stroke with a hyperdense left MCA lesion.  Underwent cerebral angiogram with revascularization of left MCA at origin.  Placement of 4 x 40 mm ultra and retrieval device.  TICI 3 revascularization.  Past medical history pertinent for hyperlipidemia hypertension and benign prostatic hypertrophy.  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  He has a history also of essential thrombocythemia.    Baseline serum creatinine 0.95 on 01/15/2019.  There is been some lability of blood pressure.  He was on enalapril which was discontinued 01/19/2019.  We are consulted for acute kidney injury with a peak creatinine of 1.82.  He did receive IV contrast 01/12/2019 and 01/19/2019.  Blood pressure 124/78 pulse 84 temperature 98.4  Sodium 142 potassium 3.2 chloride 108 CO2 25 BUN 33 creatinine 1.37 glucose 114 WBC 6.3 hemoglobin 10.52 platelets 951   2.4 L out 01/24/2019  Atenolol 50 mg daily, hydroxyurea 500 mg daily, pravastatin 80 mg daily, Flomax 0.4 mg daily, apixaban 2.5 mg daily  IV Rocephin 1 g every 24 hours  IV sodium bicarbonate 100 cc an hour   Objective:  Vital signs in last 24 hours:  Temp:  [97.4 F (36.3 C)-98.4 F (36.9 C)] 98.4 F (36.9 C) (08/29 2316) Pulse Rate:  [76-90] 84 (08/29 2316) Resp:  [14-16] 16 (08/29 2316) BP: (107-123)/(74-86) 107/74 (08/29 1949) SpO2:  [94 %-99 %] 94 % (08/29 2316)  Weight change:  Filed Weights   01/12/19 0815  Weight: 79.6 kg    Intake/Output: I/O last 3 completed shifts: In: 3481.7 [P.O.:120; I.V.:3211.7; IV Piggyback:150] Out: 3500 [Urine:3500]   Intake/Output this shift:  No intake/output data recorded. Alert pleasant gentleman nondistressed CVS- RRR no audible murmurs rubs or gallops RS- clear to auscultation with no wheezes or rales ABD- BS present soft non-distended EXT- no edema   Basic Metabolic  Panel: Recent Labs  Lab 01/21/19 0355 01/22/19 0351 01/23/19 0451 01/24/19 0533 01/25/19 0445  NA 146* 151* 145 143 142  K 3.9 3.7 3.5 3.2* 3.2*  CL 118* 120* 117* 109 108  CO2 18* 22 19* 24 25  GLUCOSE 113* 126* 127* 125* 114*  BUN 48* 49* 45* 42* 33*  CREATININE 1.58* 1.75* 1.82* 1.97* 1.37*  CALCIUM 9.1 8.9 9.1 8.6* 8.3*    Liver Function Tests: No results for input(s): AST, ALT, ALKPHOS, BILITOT, PROT, ALBUMIN in the last 168 hours. No results for input(s): LIPASE, AMYLASE in the last 168 hours. No results for input(s): AMMONIA in the last 168 hours.  CBC: Recent Labs  Lab 01/21/19 0355 01/21/19 1408 01/22/19 0351 01/23/19 0451 01/24/19 0533 01/25/19 0445  WBC 12.3*  --  6.6 8.7 8.8 6.3  HGB 7.4* 12.2* 11.2* 12.0* 11.0* 10.2*  HCT 22.2* 37.4* 35.4* 38.4* 33.7* 32.3*  MCV 104.7*  --  108.6* 109.7* 105.0* 106.6*  PLT 1,542*  --  1,174* 1,181* 1,106* 951*    Cardiac Enzymes: Recent Labs  Lab 01/23/19 1134  CKTOTAL 22*    BNP: Invalid input(s): POCBNP  CBG: Recent Labs  Lab 01/24/19 1142 01/24/19 1636 01/24/19 2055 01/24/19 2305 01/25/19 0625  GLUCAP 108* 105* 133* 118* 116*    Microbiology: Results for orders placed or performed during the hospital encounter of 01/12/19  SARS Coronavirus 2 [facility] order, Performed in [facility] hospital lab) Nasopharyngeal Nasopharyngeal Swab     Status: None   Collection Time: 01/12/19  7:52  AM   Specimen: Nasopharyngeal Swab  Result Value Ref Range Status   SARS Coronavirus 2 NEGATIVE NEGATIVE Final    Comment: (NOTE) If result is NEGATIVE SARS-CoV-2 target nucleic acids are NOT DETECTED. The SARS-CoV-2 RNA is generally detectable in upper and lower  respiratory specimens during the acute phase of infection. The lowest  concentration of SARS-CoV-2 viral copies this assay can detect is 250  copies / mL. A negative result does not preclude SARS-CoV-2 infection  and should not be used as the sole basis for  treatment or other  patient management decisions.  A negative result may occur with  improper specimen collection / handling, submission of specimen other  than nasopharyngeal swab, presence of viral mutation(s) within the  areas targeted by this assay, and inadequate number of viral copies  (<250 copies / mL). A negative result must be combined with clinical  observations, patient history, and epidemiological information. If result is POSITIVE SARS-CoV-2 target nucleic acids are DETECTED. The SARS-CoV-2 RNA is generally detectable in upper and lower  respiratory specimens dur ing the acute phase of infection.  Positive  results are indicative of active infection with SARS-CoV-2.  Clinical  correlation with patient history and other diagnostic information is  necessary to determine patient infection status.  Positive results do  not rule out bacterial infection or co-infection with other viruses. If result is PRESUMPTIVE POSTIVE SARS-CoV-2 nucleic acids MAY BE PRESENT.   A presumptive positive result was obtained on the submitted specimen  and confirmed on repeat testing.  While 2019 novel coronavirus  (SARS-CoV-2) nucleic acids may be present in the submitted sample  additional confirmatory testing may be necessary for epidemiological  and / or clinical management purposes  to differentiate between  SARS-CoV-2 and other Sarbecovirus currently known to infect humans.  If clinically indicated additional testing with an alternate test  methodology [phone]) is advised. The SARS-CoV-2 RNA is generally  detectable in upper and lower respiratory sp ecimens during the acute  phase of infection. The expected result is Negative. Fact Sheet for Patients:  StrictlyIdeas.no Fact Sheet for Healthcare Providers: BankingDealers.co.za This test is not yet approved or cleared by the [place] FDA and has been authorized for detection and/or  diagnosis of SARS-CoV-2 by FDA under an Emergency Use Authorization (EUA).  This EUA will remain in effect (meaning this test can be used) for the duration of the COVID-19 declaration under Section 564(b)(1) of the Act, 21 U.S.C. section 360bbb-3(b)(1), unless the authorization is terminated or revoked sooner. Performed at [facility], [place] [address] [place], [place] [address]   MRSA PCR Screening     Status: None   Collection Time: 01/12/19 11:27 AM   Specimen: Nasal Mucosa; Nasopharyngeal  Result Value Ref Range Status   MRSA by PCR NEGATIVE NEGATIVE Final    Comment:        The GeneXpert MRSA Assay (FDA approved for NASAL specimens only), is one component of a comprehensive MRSA colonization surveillance program. It is not intended to diagnose MRSA infection nor to guide or monitor treatment for MRSA infections. Performed at [facility], [place] [address], [place], [place] [address]   Novel Coronavirus, NAA (hospital order; send-out to ref lab)     Status: None   Collection Time: 01/19/19 12:24 AM   Specimen: Nasopharyngeal Swab; Respiratory  Result Value Ref Range Status   SARS-CoV-2, NAA NOT DETECTED NOT DETECTED Final    Comment: (NOTE) This test was developed and its  performance characteristics determined by [employer]. This test has not been FDA cleared or approved. This test has been authorized by FDA under an Emergency Use Authorization (EUA). This test is only authorized for the duration of time the declaration that circumstances exist justifying the authorization of the emergency use of in vitro diagnostic tests for detection of SARS-CoV-2 virus and/or diagnosis of COVID-19 infection under section 564(b)(1) of the Act, 21 U.S.C. KA:123727), unless the authorization is terminated or revoked sooner. When diagnostic testing is negative, the possibility of a false negative result should be considered in the context of a patient's recent  exposures and the presence of clinical signs and symptoms consistent with COVID-19. An individual without symptoms of COVID-19 and who is not shedding SARS-CoV-2 virus would expect to have a negative (not detected) result in this assay. Performed  At: [facility] [address] [place], [place] [id number] Rush Farmer MD [id number]    [place]  Final    Comment: Performed at [facility], [place] [address], [place], [place] [address]  Urine Culture     Status: Abnormal (Preliminary result)   Collection Time: 01/20/19  2:49 PM   Specimen: Urine, Random  Result Value Ref Range Status   Specimen Description URINE, RANDOM  Final   Special Requests NONE  Final   Culture (A)  Final    >=100,000 COLONIES/mL ESCHERICHIA COLI 50,000 COLONIES/mL CITROBACTER FREUNDII SUSCEPTIBILITIES TO FOLLOW Performed at [facility], [place] [address], [place], [place] [address]    Report Status PENDING  Incomplete    Coagulation Studies: No results for input(s): LABPROT, INR in the last 72 hours.  Urinalysis: Recent Labs    01/22/19 1844 01/23/19 1800  COLORURINE AMBER* AMBER*  LABSPEC 1.017 1.015  PHURINE 5.0 5.0  GLUCOSEU NEGATIVE NEGATIVE  HGBUR LARGE* LARGE*  BILIRUBINUR NEGATIVE NEGATIVE  KETONESUR NEGATIVE NEGATIVE  PROTEINUR 30* 30*  NITRITE NEGATIVE NEGATIVE  LEUKOCYTESUR TRACE* NEGATIVE      Imaging: US Renal  Result Date: 01/23/2019 CLINICAL DATA:  Acute renal failure. EXAM: RENAL / URINARY TRACT ULTRASOUND COMPLETE COMPARISON:  None. FINDINGS: Right Kidney: Renal measurements: 12.3 x 5.0 x 5.4 cm = volume: 173 mL . Echogenicity within normal limits. 2.7 cm and 0.7 cm cysts. Mild hydronephrosis and proximal hydroureter. Left Kidney: Renal measurements: 12.8 x 5.7 x 5.4 cm = volume: 208 mL. Echogenicity within normal limits. No mass identified with assessment somewhat limited by bowel gas. Mild hydronephrosis and proximal hydroureter.  Bladder: Moderately distended and otherwise unremarkable. The patient was unable to void. Ureteral jets were not visualized during the examination. IMPRESSION: 1. Mild bilateral hydronephrosis and proximal hydroureter which may be secondary to bladder distension. The patient was unable to void during the examination. 2. Right renal cysts. Electronically Signed   By: Logan Bores M.D.   On: 01/23/2019 12:50     Medications:   . dextrose 75 mL/hr at 01/25/19 0324   .  stroke: mapping our early stages of recovery book   Does not apply Once  . apixaban  2.5 mg Oral BID  . atenolol  50 mg Oral Daily  . hydroxyurea  500 mg Oral Daily  . pravastatin  80 mg Oral Daily  . tamsulosin  0.4 mg Oral Daily   acetaminophen **OR** acetaminophen (TYLENOL) oral liquid 160 mg/5 mL **OR** acetaminophen, bisacodyl, hydrocerin, metoprolol tartrate, QUEtiapine, Resource ThickenUp Clear  Assessment/ Plan:   Acute kidney injury with bilateral hydronephrosis noted on ultrasound.  Foley catheter placed 01/24/2019 with brisk diuresis.  He did have also have some blood pressure instability as well as use of IV contrast and ACE inhibitor enalapril.  Creatinine has improved to 1.37  Hypokalemia we will replete with IV potassium  Anemia stable not an issue at this time  Essential thrombocythemia continue hydroxyurea  History of CVA left basal ganglia infarct with LM1 status post IR with TICI 3 reperfusion  History of atrial fibrillation Eliquis  Urinalysis E. coli UTI 01/20/2019 treated with IV Rocephin  Hypertension volume continue to avoid ACE inhibitors and ARB's in setting of acute kidney injury blood pressure appears under good control  Metabolic acidosis resolved   LOS: [place] @TODAY @7 :48 AM

## 2019-01-25 NOTE — Progress Notes (Signed)
STROKE TEAM PROGRESS NOTE   INTERVAL HISTORY  Neuro stable, no change.  Foley placed yesterday. Apixaban for afib, pharmacy consulting. Enalapril discontinued for AKI. Labile BPs but today 119/80-143/91. Creatinine improved from 1.82 to 1.37. Apixaban 2.5 bid due to AKI, may increase to 5mg  bid when renal function improves. Nephrology repleated K+ today again.Anemia with MCV 105, check a B12.   Vitals:   01/24/19 1949 01/24/19 2316 01/25/19 0921 01/25/19 1124  BP: 107/74  (!) 143/91 119/80  Pulse: 76 84 80 82  Resp:  16 16 13   Temp: 98.2 F (36.8 C) 98.4 F (36.9 C) 98.1 F (36.7 C) 98.1 F (36.7 C)  TempSrc: Oral Oral Oral Oral  SpO2: 99% 94% 95% 98%  Weight:      Height:        CBC:  Recent Labs  Lab 01/24/19 0533 01/25/19 0445  WBC 8.8 6.3  HGB 11.0* 10.2*  HCT 33.7* 32.3*  MCV 105.0* 106.6*  PLT 1,106* 951*    Basic Metabolic Panel:  Recent Labs  Lab 01/24/19 0533 01/25/19 0445  NA 143 142  K 3.2* 3.2*  CL 109 108  CO2 24 25  GLUCOSE 125* 114*  BUN 42* 33*  CREATININE 1.97* 1.37*  CALCIUM 8.6* 8.3*   Lipid Panel:     Component Value Date/Time   CHOL 145 01/13/2019 0212   TRIG 97 01/13/2019 0212   HDL 39 (L) 01/13/2019 0212   CHOLHDL 3.7 01/13/2019 0212   VLDL 19 01/13/2019 0212   LDLCALC 87 01/13/2019 0212   HgbA1c:  Lab Results  Component Value Date   HGBA1C 5.1 01/13/2019   Urine Drug Screen:     Component Value Date/Time   LABOPIA NONE DETECTED 01/12/2019 0747   COCAINSCRNUR NONE DETECTED 01/12/2019 0747   LABBENZ NONE DETECTED 01/12/2019 0747   AMPHETMU NONE DETECTED 01/12/2019 0747   THCU NONE DETECTED 01/12/2019 0747   LABBARB NONE DETECTED 01/12/2019 0747    Alcohol Level     Component Value Date/Time   ETH <10 01/12/2019 0747   Urinalysis    Component Value Date/Time   COLORURINE AMBER (A) 01/23/2019 1800   APPEARANCEUR HAZY (A) 01/23/2019 1800   LABSPEC 1.015 01/23/2019 1800   PHURINE 5.0 01/23/2019 1800   GLUCOSEU  NEGATIVE 01/23/2019 1800   HGBUR LARGE (A) 01/23/2019 1800   BILIRUBINUR NEGATIVE 01/23/2019 1800   KETONESUR NEGATIVE 01/23/2019 1800   PROTEINUR 30 (A) 01/23/2019 1800   NITRITE NEGATIVE 01/23/2019 1800   LEUKOCYTESUR NEGATIVE 01/23/2019 1800    IMAGING  Ct Head Code Stroke Wo Contrast 01/12/2019 1. Equivocal for hyperdense left MCA. No hemorrhage or visible acute infarct.ASPECTS is 10.  2. Prominent atrophy and chronic small vessel ischemia.   Ct Code Stroke Cta Head W/wo Contrast Ct Code Stroke Cta Neck W/wo Contrast 01/12/2019 1. Left M1 embolus/occlusion with downstream reconstitution. There is 92 cc penumbra with no core infarct by CT perfusion.  2. Limited atheromatous changes.   Ct Code Stroke Cta Cerebral Perfusion W/wo Contrast 01/12/2019 1. Left M1 embolus/occlusion with downstream reconstitution. There is 92 cc penumbra with no core infarct by CT perfusion.  2. Limited atheromatous changes.   Cerebral Angiogram S/P lt common carotid arteriogram followed bt complete revascularization of occluded Lt MCA at origin with x 1 pass with 23mm x 40 mm solitaire x  Retriever  device achieving a TICI 3 revascularization.  Mr Brain Wo Contrast 01/13/2019 1. Patchy small volume acute ischemic nonhemorrhagic infarcts involving the left basal  ganglia and subcortical left frontal lobe as above.  2. Underlying age-related cerebral atrophy with moderate chronic microvascular ischemic disease.   2D Echocardiogram  1. The left ventricle has hyperdynamic systolic function, with an ejection fraction of >65%. The cavity size was normal. There is mildly increased left ventricular wall thickness. Left ventricular diastolic Doppler parameters are indeterminate. No  evidence of left ventricular regional wall motion abnormalities.  2. The right ventricle has normal systolic function. The cavity was normal. There is no increase in right ventricular wall thickness.  3. Left atrial size was  moderately dilated.  4. The pericardial effusion is localized near the right atrium.  5. Trivial pericardial effusion is present.  6. Tricuspid valve regurgitation is moderate.  7. The aortic valve is tricuspid. Mild thickening of the aortic valve. Mild calcification of the aortic valve. Aortic valve regurgitation is mild by color flow Doppler.  8. The aorta is normal unless otherwise noted.  9. No intracardiac thrombi or masses were visualized.  Renal US 1. Mild bilateral hydronephrosis and proximal hydroureter which may be secondary to bladder distension. The patient was unable to void during the examination. 2. Right renal cysts.   PHYSICAL EXAM    Frail elderly Caucasian male not in distress.  He is hard of hearing.  Afebrile. Head is nontraumatic. Neck is supple without bruit.  Cardiac exam no murmur or gallop, however, irregularly irregular heart rate and rhythm. Lungs are clear to auscultation. Distal pulses are well felt. Neurological Exam ; Awake alert moderate to severe dysarthric but hardly to be understood.  Follows commands well.  Oriented to place and year and person, however not orientated to month.  Diminished attention, registration and recall.  Extraocular movements are full range without nystagmus.  Blinks to threat bilaterally.  Right lower facial weakness.  Tongue midline.  Symmetric upper and lower extremity strength.  Sensation is intact bilaterally.  Deep tendon flexes are symmetric.  Finger-to-nose intact bilaterally but slow action.  Plantars are downgoing.  Gait not tested.   ASSESSMENT/PLAN [name] is a 85 y.o. male with history of thrombocythemia, hyperlipidemia, hypertension, atrial fibrillation not on anticoagulation presenting with aphasia and R sided weakness.   Stroke:   L basal ganglia infarct with L M1 cut off s/p IR with TICI3 reperfusion- embolic secondary to known AF not on [facility]  Code Stroke CT head hyperdense L MCA. No acute abnormality.      CTA head & neck L M1 occlusion  CT perfusion 92cc penumbra w/ no core infarct  Cerebral angio L MCA occlusion at origin w/ TICI3 revascularization  MRI  Patchy small L basal ganglia and subcoritcal L frontal lobe infarcts.   2D Echo EF >65%. No source of embolus. LA mod dilated.   LDL 87  HgbA1c 5.1  Eliquis for VTE prophylaxis  aspirin 81 mg daily prior to admission, on eliquis now. Given AKI, now on eliquis 2.5mg  bid. Consider increase to 5mg  when appropriate, pharmacy dosing  therapy recommendations: SNF  Disposition:  pending  (caregiver for wife who has dementia). Have received insurance auth.   Repeat COVID test for d/c - neg   Microscopic hematuria on Eliquis  UA w/ 21-50 RBC 8/12  Repeat UA  > 50 RBC, many bact, 30 prot -> unchanged  UCx 01/20/19 >100k ecoli   UA repeat -> unchanged  On Rocephin 8/26 -> 8/29  However, no gross hematuria  ?? Anemia   Stable Anemia  Likely lab error 8/26  UA showed RBC > 50 but no gross hematuria - repeat unchanged  MCV 105, check B12  Atrial Fibrillation w/ RVR  Home anticoagulation:  none   Likely the cause of current stroke . added Eliquis for stroke prevention . Continue Eliquis on discharge, pharmacy dosing . A. fib RVR resolved . On atenolol   Essential thrombocythemia  On hydroxyurea PTA  Jak 2 mutation positive  Platelet still quite high 604->660->798->905->1055->1542->1174->1181->1106 - 753, likely reactive  Discontinue aspirin 81   Discussed with Dr. Alen Blew who felt it is reactive, no new intervention necessary  AKI Metabolic Acidoses  Cre 0000000 - 1.37  Nephrology on board   NS->D5->bicarb @ 100cc/h  Encourage po intake  BMP monitoring  Renal US mild B hydronephrosis and prox hydroureter. R renal cysts.    Pt hx of BPH -> Nephrology placed foley  UTI treated with IV Rocephin  Metabolic acidosis resolved stopped Iv bicarb  Appreciate  Nephrology colleagues  Hypernatremia  Na 144->146->151->145->143 - 141  resolved  BMP monitoring  Encourage po intake.   Hypertension  Home meds: Atenolol 50, enalapril 20 bid, HCTZ 12.5  Stable now  Resumed atenolol, hold off enalapril and HCTZ due to elevated Cre  Long-term BP goal normotensive  Hyperlipidemia  Home meds:  pravachol 80  LDL 87, goal < 70  Resumed Pravachol 80  CK low at 22   Continue statin at discharge  Dysphagia . Secondary to stroke . D2 nectar thick liquid diet w/ MBSS  . on IVF @ 100/hr . Speech on board   Other Stroke Risk Factors  Advanced age  Hx smokeless tobacco user  Hx ETOH use  Other Active Problems  BPH on flomax  Agitation - likely sundowning on 8/18 requiring restraints and haldol - 8/19 resolved -on Seroquel hs prn, sitter as needed  Hypokalemia - 3.2 - supplemented - recheck in morning  Hospital day # 13  Dr. Erlinda Hong had long discussion with son over the phone last week, updated pt current condition, treatment plan and potential prognosis. They expressed understanding and appreciation.   Personally examined patient and images, and have participated in and made any corrections needed to history, physical, neuro exam,assessment and plan as stated above.  I have personally obtained the history, evaluated lab date, reviewed imaging studies and agree with radiology interpretations.    Sarina Ill, MD Stroke Neurology   A total of 25 minutes was spent for the care of this patient, spent on counseling patient and family on different diagnostic and therapeutic options, counseling and coordination of care, riskd ans benefits of management, compliance, or risk factor reduction and education.      To contact Stroke Continuity provider, please refer to [email or web address] After hours, contact General Neurology

## 2019-01-25 NOTE — Progress Notes (Signed)
ANTICOAGULATION CONSULT NOTE - Initial Consult  Pharmacy Consult for Apixaban Indication: atrial fibrillation  Allergies  Allergen Reactions  . Codeine     Per son, severity unknown    Patient Measurements: Height: 5\' 10"  (177.8 cm) Weight: 175 lb 7.8 oz (79.6 kg) IBW/kg (Calculated) : 73   Vital Signs: Temp: 98.4 F (36.9 C) (08/29 2316) Temp Source: Oral (08/29 2316) BP: 107/74 (08/29 1949) Pulse Rate: 84 (08/29 2316)  Labs: Recent Labs    01/23/19 0451 01/23/19 1134 01/24/19 0533 01/25/19 0445  HGB 12.0*  --  11.0* 10.2*  HCT 38.4*  --  33.7* 32.3*  PLT 1,181*  --  1,106* 951*  CREATININE 1.82*  --  1.97* 1.37*  CKTOTAL  --  22*  --   --     Estimated Creatinine Clearance: 38.5 mL/min (A) (by C-G formula based on SCr of 1.37 mg/dL (H)).   Medical History: Past Medical History:  Diagnosis Date  . BPH (benign prostatic hyperplasia)   . Essential thrombocythemia ([place])   . Hyperlipidemia   . Hypertension   . Osteoporosis     Assessment: [name] is a 85 y.o. male with history of thrombocythemia, hyperlipidemia, hypertension, atrial fibrillation not on anticoagulation presenting with aphasia and R sided weakness.  Found to have a Stroke:   L basal ganglia infarct s/p IR occluded L MCA - embolic secondary to known AF not on AC.  Pharmacy consulted to initiate Apixaban dosing.  85yo, 79.6 kg and Scr 1.37. Today is the first day Scr <1.5, patient technically now indicated for 5mg  BID dosing again, consider changing back when Scr consistently <1.5. Will continue 2.5 BID today   Plan:  Continue Apixaban at 2.5 mg po bid  Nicoletta Dress, PharmD PGY2 Infectious Disease Pharmacy Resident  Please see AMION for all Pharmacists' Contact Phone Numbers 01/25/2019, 7:37 AM

## 2019-01-26 DIAGNOSIS — N400 Enlarged prostate without lower urinary tract symptoms: Secondary | ICD-10-CM | POA: Diagnosis present

## 2019-01-26 DIAGNOSIS — E876 Hypokalemia: Secondary | ICD-10-CM | POA: Diagnosis not present

## 2019-01-26 DIAGNOSIS — E872 Acidosis, unspecified: Secondary | ICD-10-CM | POA: Diagnosis present

## 2019-01-26 DIAGNOSIS — N179 Acute kidney failure, unspecified: Secondary | ICD-10-CM | POA: Diagnosis present

## 2019-01-26 DIAGNOSIS — R319 Hematuria, unspecified: Secondary | ICD-10-CM | POA: Diagnosis present

## 2019-01-26 LAB — VITAMIN B12: Vitamin B-12: 1834 pg/mL — ABNORMAL HIGH (ref 180–914)

## 2019-01-26 LAB — BASIC METABOLIC PANEL
Anion gap: 8 (ref 5–15)
BUN: 24 mg/dL — ABNORMAL HIGH (ref 8–23)
CO2: 26 mmol/L (ref 22–32)
Calcium: 8.4 mg/dL — ABNORMAL LOW (ref 8.9–10.3)
Chloride: 104 mmol/L (ref 98–111)
Creatinine, Ser: 1.24 mg/dL (ref 0.61–1.24)
GFR calc Af Amer: 60 mL/min — ABNORMAL LOW (ref >60)
GFR calc non Af Amer: 52 mL/min — ABNORMAL LOW (ref >60)
Glucose, Bld: 108 mg/dL — ABNORMAL HIGH (ref 70–99)
Potassium: 3.2 mmol/L — ABNORMAL LOW (ref 3.5–5.1)
Sodium: 138 mmol/L (ref 135–145)

## 2019-01-26 LAB — CBC
HCT: 32.8 % — ABNORMAL LOW (ref 39.0–52.0)
Hemoglobin: 10.6 g/dL — ABNORMAL LOW (ref 13.0–17.0)
MCH: 34.3 pg — ABNORMAL HIGH (ref 26.0–34.0)
MCHC: 32.3 g/dL (ref 30.0–36.0)
MCV: 106.1 fL — ABNORMAL HIGH (ref 80.0–100.0)
Platelets: 959 10*3/uL (ref 150–400)
RBC: 3.09 MIL/uL — ABNORMAL LOW (ref 4.22–5.81)
RDW: 13.2 % (ref 11.5–15.5)
WBC: 7.3 10*3/uL (ref 4.0–10.5)
nRBC: 0 % (ref 0.0–0.2)

## 2019-01-26 LAB — GLUCOSE, CAPILLARY: Glucose-Capillary: 128 mg/dL — ABNORMAL HIGH (ref 70–99)

## 2019-01-26 MED ORDER — HYDROXYUREA 500 MG PO CAPS: 500.0000 mg | ORAL_CAPSULE | Freq: Every day | ORAL | Status: AC

## 2019-01-26 MED ORDER — POTASSIUM CHLORIDE 10 MEQ/100ML IV SOLN
10.0000 meq | INTRAVENOUS | Status: AC
  Administered 2019-01-26 (×3): 10 meq via INTRAVENOUS
  Filled 2019-01-26 (×3): qty 100

## 2019-01-26 MED ORDER — HYDROCERIN EX CREA: 1.0000 "application " | TOPICAL_CREAM | Freq: Two times a day (BID) | CUTANEOUS | 0 refills | Status: AC | PRN

## 2019-01-26 MED ORDER — METOPROLOL TARTRATE 5 MG/5ML IV SOLN: 2.5000 mg | Freq: Four times a day (QID) | INTRAVENOUS | Status: AC | PRN

## 2019-01-26 MED ORDER — ATENOLOL 50 MG PO TABS: 50.0000 mg | ORAL_TABLET | Freq: Every day | ORAL | Status: AC

## 2019-01-26 MED ORDER — APIXABAN 5 MG PO TABS: 5.0000 mg | ORAL_TABLET | Freq: Two times a day (BID) | ORAL | Status: DC

## 2019-01-26 MED ORDER — APIXABAN 5 MG PO TABS: 5.0000 mg | ORAL_TABLET | Freq: Two times a day (BID) | ORAL | Status: AC

## 2019-01-26 MED ORDER — RESOURCE THICKENUP CLEAR PO POWD: 1.0000 | ORAL | Status: AC | PRN

## 2019-01-26 MED ORDER — QUETIAPINE FUMARATE 25 MG PO TABS: 12.5000 mg | ORAL_TABLET | Freq: Every evening | ORAL | Status: AC | PRN

## 2019-01-26 NOTE — Progress Notes (Signed)
[facility] NEPHROLOGY PROGRESS NOTE  Assessment/ Plan: Pt is a 85 y.o. yo male with hypertension, hyperlipidemia, BPH, essential thrombocythemia, acute stroke underwent cerebral angiogram with revascularization of left MCA.  #Acute kidney injury likely due to IV contrast and ACE inhibitor.  The peak creatinine level was 1.82 and now improving to 1.24.  Patient has good urine output.  Renal US showedh mild bilateral hydronephrosis and proximal hydroureter probably secondary due to bladder distention.  He has a history of BPH.  Continue Flomax.  Can discontinue Foley catheter and follow-up bladder scan to make sure he is not having urine retention.  Recommend to have follow-up ultrasound kidneys  to make sure the hydronephrosis is resolved. May need outpatient urology follow up. -Off enalapril.  #Hypokalemia: Due to increased urinary loss.  Replete potassium chloride.  Encourage oral intake.  #Hypertension: Blood pressure acceptable.  Continue atenolol.  Hold ACE inhibitor for now however can resume as an outpatient by PCP when renal function stable.  #Acute stroke: Per neurology.  #Essential thrombocytopenia on hydroxyurea.  #Metabolic acidosis.  Improved now.  Renal function is improved.  I will sign off at this time.  Patient can follow-up with PCP.  Subjective: Seen and examined at bedside.  Denied headache, dizziness, nausea vomiting chest pain or shortness of breath. Objective Vital signs in last 24 hours: Vitals:   01/25/19 1923 01/26/19 0000 01/26/19 0400 01/26/19 0837  BP:  135/81 (!) 145/82 (!) 133/98  Pulse: 84 98 79 69  Resp:    20  Temp: 98.4 F (36.9 C)  97.9 F (36.6 C) 98.3 F (36.8 C)  TempSrc: Oral  Oral Oral  SpO2: 100% 98% 100% 98%  Weight:      Height:       Weight change:   Intake/Output Summary (Last 24 hours) at 01/26/2019 0854 Last data filed at 01/26/2019 0456 Gross per 24 hour  Intake 1926.15 ml  Output 1675 ml  Net 251.15 ml        Labs: Basic Metabolic Panel: Recent Labs  Lab 01/24/19 0533 01/25/19 0445 01/26/19 0410  NA 143 142 138  K 3.2* 3.2* 3.2*  CL 109 108 104  CO2 24 25 26   GLUCOSE 125* 114* 108*  BUN 42* 33* 24*  CREATININE 1.97* 1.37* 1.24  CALCIUM 8.6* 8.3* 8.4*   Liver Function Tests: No results for input(s): AST, ALT, ALKPHOS, BILITOT, PROT, ALBUMIN in the last 168 hours. No results for input(s): LIPASE, AMYLASE in the last 168 hours. No results for input(s): AMMONIA in the last 168 hours. CBC: Recent Labs  Lab 01/22/19 0351 01/23/19 0451 01/24/19 0533 01/25/19 0445 01/26/19 0410  WBC 6.6 8.7 8.8 6.3 7.3  HGB 11.2* 12.0* 11.0* 10.2* 10.6*  HCT 35.4* 38.4* 33.7* 32.3* 32.8*  MCV 108.6* 109.7* 105.0* 106.6* 106.1*  PLT 1,174* 1,181* 1,106* 951* 959*   Cardiac Enzymes: Recent Labs  Lab 01/23/19 1134  CKTOTAL 22*   CBG: Recent Labs  Lab 01/24/19 2305 01/25/19 0625 01/25/19 1126 01/25/19 1634 01/25/19 2217  GLUCAP 118* 116* 115* 116* 142*    Iron Studies: No results for input(s): IRON, TIBC, TRANSFERRIN, FERRITIN in the last 72 hours. Studies/Results: No results found.  Medications: Infusions: . dextrose 75 mL/hr at 01/25/19 0324    Scheduled Medications: .  stroke: mapping our early stages of recovery book   Does not apply Once  . apixaban  2.5 mg Oral BID  . atenolol  50 mg Oral Daily  . hydroxyurea  500  mg Oral Daily  . pravastatin  80 mg Oral Daily  . tamsulosin  0.4 mg Oral Daily    have reviewed scheduled and prn medications.  Physical Exam: General:NAD, comfortable Heart:RRR, s1s2 nl Lungs:clear b/l, no crackle Abdomen:soft, Non-tender, non-distended Extremities:No edema Neurology: Alert awake and following simple commands GU: Foley catheter, has clear urine.  Anslee Micheletti Prasad Malone Admire 01/26/2019,8:54 AM  LOS: 14 days  Pager: ID:[phone]

## 2019-01-26 NOTE — Progress Notes (Addendum)
  Speech Language Pathology Treatment: Dysphagia  Patient Details Name: Nicholas Contreras MRN: [id number] DOB: 11-03-1928 Today's Date: 01/26/2019 Time: [id number] SLP Time Calculation (min) (ACUTE ONLY): 15 min  Assessment / Plan / Recommendation Clinical Impression  Pt awake upon arrival of SLP, however, reported that he wanted to go to sleep and rest. Pt agreed to trial of nectar thick liquids, and was repositioned upright.  Pt required assistance with holding the cup. No overt s/s of aspiration given multiple boluses, however, pt was noted to exhibit belching today. Pt declined further po trials. Lungs clear, pt afebrile. Reduced intelligibility noted, largely due to rapid rate of speech. Pt was not able to utilize strategies to improve intelligibility via slower rate and overarticulation.  Continue per plan of care.   HPI HPI: Nicholas Contreras is a 85 y.o. male admitted with speech disturbance, aphasia and decreased mobility. MRI Patchy small volume acute ischemic nonhemorrhagic infarcts involving the left basal ganglia and subcortical left frontal lobe, Underlying age-related cerebral atrophy with moderate chronic microvascular ischemic disease.      SLP Plan  Continue with current plan of care       Recommendations  Diet recommendations: Dysphagia 2 (fine chop);Nectar-thick liquid Liquids provided via: Cup;No straw Medication Administration: Crushed with puree Supervision: Staff to assist with self feeding;Full supervision/cueing for compensatory strategies Compensations: Chin tuck;Clear throat intermittently;Slow rate;Small sips/bites;Minimize environmental distractions Postural Changes and/or Swallow Maneuvers: Chin tuck                Oral Care Recommendations: Oral care BID Follow up Recommendations: Skilled Nursing facility SLP Visit Diagnosis: Dysphagia, oropharyngeal phase (R13.12) Plan: Continue with current plan of care       GO                B. Quentin Ore  [facility], CCC-SLP Speech Language Pathologist [phone]  Shonna Chock 01/26/2019, 1:27 PM

## 2019-01-26 NOTE — TOC Transition Note (Signed)
Transition of Care [facility]) - CM/SW Discharge Note   Patient Details  Name: Nicholas Contreras MRN: [id number] Date of Birth: 05-22-1929  [facility]) CM/SW Contact:  Geralynn Ochs, LCSW Phone Number: 01/26/2019, 2:43 PM   Clinical Narrative:   Nurse to call report to [phone], Room 3246.  Transport set for 4:30 PM.    Final next level of care: [place] Barriers to Discharge: Continued Medical Work up   Patient Goals and CMS Choice   CMS Medicare.gov Compare Post Acute Care list provided to:: Patient Represenative (must comment) Choice offered to / list presented to : Adult Children(son)  Discharge Placement              Patient [facility] at: [facility] Patient to be transferred to facility by: [place] Name of family member notified: [name] Patient and family notified of of transfer: 01/26/19  Discharge Plan and Services In-house Referral: Clinical Social Work Discharge Planning Services: CM Consult Post Acute Care Choice: [place]                               Social Determinants of Health (SDOH) Interventions     Readmission Risk Interventions No flowsheet data found.

## 2019-01-26 NOTE — TOC Progression Note (Signed)
LATE NOTE SUBMISSION    Transition of Care [facility]) - Progression Note    Patient Details  Name: Nicholas Contreras MRN: [id number] Date of Birth: 02-Feb-1929  [facility]) CM/SW [place], [place] Phone Number: 01/26/2019, 1:06 PM  Clinical Narrative:  CSW spoke with patient's son, [name], who was frustrated that he was told earlier in the week that his father was ready for discharge and then he wasn't. [name] indicated that he signed paperwork at [facility] that he agreed to be charged $300 a day for the bed, and CSW reassured [name] that he was not being charged because he wasn't stable for discharge anymore. CSW confirmed with [facility] admissions that son was not being charged. [name] appreciated the update, and is hopeful that his father can discharge from the hospital soon.      Expected Discharge Plan: [place] Barriers to Discharge: Continued Medical Work up  Expected Discharge Plan and Services Expected Discharge Plan: [place] In-house Referral: Clinical Social Work Discharge Planning Services: CM Consult Post Acute Care Choice: [place]                                         Social Determinants of Health (SDOH) Interventions    Readmission Risk Interventions No flowsheet data found.

## 2019-01-26 NOTE — Progress Notes (Signed)
Physical Therapy Treatment Patient Details Name: Nicholas Contreras MRN: [id number] DOB: 24-Sep-1928 Today's Date: 01/26/2019    History of Present Illness Patient is an 85 y/o male who presents with right sided weakness and aphasia. CTA- Left M1 occlusion s/p mechanical thrombectomy 8/17 achieving revascularization. Brain MRI-patchy infarcts left basal ganglia and subcortical left frontal lobe. PMH includes A-fib not on anticoagulation, HTN, HLD.    PT Comments    Pt progressing towards physical therapy goals. +2 assist required for all aspects of functional mobility however overall appears improved from last PT session. Recommend focus on achieving bilateral knee extension during sit<>stand transfers next session and may do better pulling to stand. Today, pt is appropriate for transfer back to bed utilizing [facility] with nursing staff. Lap belt chair alarm placed as RN reports pt has been somewhat impulsive in room. Will continue to follow and progress as able per POC.    Follow Up Recommendations  SNF;Supervision/Assistance - 24 hour     Equipment Recommendations  Wheelchair (measurements PT);Wheelchair cushion (measurements PT);Hospital bed(Will defer final recommendations to SNF)    Recommendations for Other Services Rehab consult     Precautions / Restrictions Precautions Precautions: Fall Precaution Comments: left sided weakness Restrictions Weight Bearing Restrictions: No    Mobility  Bed Mobility Overal bed mobility: Needs Assistance Bed Mobility: Supine to Sit     Supine to sit: Max assist;HOB elevated;+2 for physical assistance     General bed mobility comments: Pt initiating LE movement towards EOB however required multi-modal cues to complete transition to sitting EOB. Bed pad utilized for trunk support as well as +2 assist at shoulders to sit up.   Transfers Overall transfer level: Needs assistance Equipment used: Rolling walker (2 wheeled);2 person hand held  assist Transfers: Sit to/from [employer] Sit to Stand: Max assist;+2 physical assistance;From elevated surface   Squat pivot transfers: Max assist;+2 physical assistance;From elevated surface     General transfer comment: Attempted sit<>stand x2 with RW from EOB. Pt with flexed knees and unable to achieve full stand with +2 max assist. We were able to squat-pivot transfer bed>chair without RW, bed pad under hips for support, and bilateral knee block.   Ambulation/Gait             General Gait Details: Unable to progress gait training at this time.    Stairs             Wheelchair Mobility    Modified Rankin (Stroke Patients Only) Modified Rankin (Stroke Patients Only) Pre-Morbid Rankin Score: No significant disability Modified Rankin: Severe disability     Balance Overall balance assessment: Needs assistance Sitting-balance support: Feet supported;Bilateral upper extremity supported Sitting balance-Leahy Scale: Poor Sitting balance - Comments: Posterior lean limiting sitting balance today. He was able to correct with cues for hands on knees and demonstrated the ability to reach down to socks to overcome the posterior lean as well.  Postural control: Posterior lean Standing balance support: Bilateral upper extremity supported Standing balance-Leahy Scale: Zero Standing balance comment: +2 assist required for any attempts at standing/maintaining standing balance.                             Cognition Arousal/Alertness: Awake/alert Behavior During Therapy: WFL for tasks assessed/performed Overall Cognitive Status: Impaired/Different from baseline Area of Impairment: Orientation;Attention;Following commands;Safety/judgement;Awareness;Problem solving;Memory                 Orientation Level: Place Current  Attention Level: Sustained Memory: Decreased short-term memory Following Commands: Follows one step commands with increased  time;Follows multi-step commands inconsistently Safety/Judgement: Decreased awareness of safety;Decreased awareness of deficits Awareness: Emergent Problem Solving: Slow processing;Decreased initiation;Difficulty sequencing;Requires verbal cues;Requires tactile cues General Comments: Pt oriented to day of the week but is not oriented to place and does not appear to be oriented to situation however did not specifically ask.       Exercises      General Comments        Pertinent Vitals/Pain Pain Assessment: No/denies pain Faces Pain Scale: No hurt    Home Living                      Prior Function            PT Goals (current goals can now be found in the care plan section) Acute Rehab PT Goals Patient Stated Goal: To see his son, Nicholas Contreras PT Goal Formulation: With patient Time For Goal Achievement: 01/27/19 Potential to Achieve Goals: Fair Progress towards PT goals: Progressing toward goals    Frequency    Min 3X/week      PT Plan Current plan remains appropriate    Co-evaluation              AM-PAC PT "6 Clicks" Mobility   Outcome Measure  Help needed turning from your back to your side while in a flat bed without using bedrails?: A Lot Help needed moving from lying on your back to sitting on the side of a flat bed without using bedrails?: A Lot Help needed moving to and from a bed to a chair (including a wheelchair)?: Total Help needed standing up from a chair using your arms (e.g., wheelchair or bedside chair)?: A Lot Help needed to walk in hospital room?: Total Help needed climbing 3-5 steps with a railing? : Total 6 Click Score: 9    End of Session Equipment Utilized During Treatment: Gait belt Activity Tolerance: Patient limited by fatigue Patient left: in chair;with call bell/phone within reach;with chair alarm set(Lap belt chair alarm) Nurse Communication: Mobility status;Need for lift equipment(Use of [place]; discussed with NT in hall.  ) PT Visit Diagnosis: Hemiplegia and hemiparesis;Difficulty in walking, not elsewhere classified (R26.2);Muscle weakness (generalized) (M62.81);Unsteadiness on feet (R26.81) Hemiplegia - Right/Left: Right Hemiplegia - dominant/non-dominant: Dominant Hemiplegia - caused by: Cerebral infarction     Time: [id number] PT Time Calculation (min) (ACUTE ONLY): 23 min  Charges:  $Gait Training: 23-37 mins                     Rolinda Roan, PT, DPT Acute Rehabilitation Services Pager: [phone] Office: [place] 01/26/2019, 3:24 PM

## 2019-02-01 ENCOUNTER — Other Ambulatory Visit: Payer: Self-pay | Admitting: Internal Medicine

## 2019-02-01 DIAGNOSIS — D473 Essential (hemorrhagic) thrombocythemia: Secondary | ICD-10-CM

## 2019-02-01 DIAGNOSIS — D75839 Thrombocytosis, unspecified: Secondary | ICD-10-CM

## 2019-02-24 ENCOUNTER — Encounter: Payer: Self-pay | Admitting: Adult Health

## 2019-02-24 ENCOUNTER — Inpatient Hospital Stay: Payer: Medicare Other | Admitting: Adult Health

## 2019-02-24 NOTE — Progress Notes (Deleted)
[facility] [address] [place]. [place] [address] [phone]       HOSPITAL FOLLOW UP NOTE  [name] Date of Birth:  November 08, 1928 Medical Record Number:  [id number]   Reason for Referral:  hospital stroke follow up    CHIEF COMPLAINT:  No chief complaint on file.   HPI: Nicholas Contreras being seen today for in office hospital follow-up regarding left BG infarct s/p mechanical thrombectomy of occluded left MCA embolic secondary to known AF not on AC on 01/12/2019.  History obtained from *** and chart review. Reviewed all radiology images and labs personally.  Mr.Nicholas Contreras a 85 y.o.malewith history of thrombocythemia, hyperlipidemia, hypertension, atrial fibrillation not on anticoagulationpresented on 01/12/2019 with aphasia and R sided weakness. Stat CT head was performed which showed no acute hemorrhage. CT angiogram demonstrated a left M1 occlusion and CT perfusion showed a mismatch of 92 ml.He was more of a TPA candidate as he was outside window as last known normal night prior when he went to bed.  NIHSS 16.  He was taken to IR for mechanical thrombectomy of left MCA occlusion at origin with TICI 3 revascularization with 1 pass solitaire.  Repeat CT negative for ICH or mass-effect.  MRI showed patchy small left basal ganglia and subcortical left frontal lobe infarcts secondary to known AF not on AC.  2D echo normal EF without cardiac source of embolus identified.  LDL 87.  A1c 5.1.  Recommended initiating Eliquis 5 mg twice daily.  HTN stable and recommended long-term BP goal normotensive range.  Continuation of Epitol 80 mg daily for HLD management.  Other stroke risk factors include advanced age, history of smokeless tobacco and history of EtOH use.  Other active problems include microscopic hematuria on Eliquis without gross hematuria, possible anemia resolved, essential thrombocytopenia likely reactive without intervention necessary, AKI due to  IV contrast and ACE inhibitor with mild bilateral hydronephrosis and recommended follow-up outpatient for monitoring, hyponatremia resolved, hypokalemia, BPH on Flomax and agitation likely secondary to sundowning and initiation of Seroquel.  He was discharged to SNF for PT/OT/ST in stable condition on 01/26/2019.     ROS:   14 system review of systems performed and negative with exception of ***  PMH:  Past Medical History:  Diagnosis Date   BPH (benign prostatic hyperplasia)    Essential thrombocythemia ([place])    Hyperlipidemia    Hypertension    Osteoporosis     PSH:  Past Surgical History:  Procedure Laterality Date   IR CT HEAD LTD  01/12/2019   IR PERCUTANEOUS ART THROMBECTOMY/INFUSION INTRACRANIAL INC DIAG ANGIO  01/12/2019   RADIOLOGY WITH ANESTHESIA N/A 01/12/2019   Procedure: IR WITH ANESTHESIA;  Surgeon: Radiologist, Medication, MD;  Location: [place];  Service: Radiology;  Laterality: N/A;   SKIN CANCER EXCISION     was on head     Social History:  Social History   Socioeconomic History   Marital status: Married    Spouse name: Not on file   Number of children: Not on file   Years of education: Not on file   Highest education level: Not on file  Occupational History   Not on file  Social Needs   Financial resource strain: Not hard at all   Food insecurity    Worry: Never true    Inability: Never true   Transportation needs    Medical: No    Non-medical: No  Tobacco Use   Smoking status: Never  Smoker   Smokeless tobacco: Former Network engineer and Sexual Activity   Alcohol use: Not Currently   Drug use: Never   Sexual activity: Not Currently  Lifestyle   Physical activity    Days per week: 0 days    Minutes per session: 0 min   Stress: Not at all  Relationships   Social connections    Talks on phone: More than three times a week    Gets together: Once a week    Attends religious service: 1 to 4 times per year    Active  member of club or organization: Yes    Attends meetings of clubs or organizations: 1 to 4 times per year    Relationship status: Patient refused   Intimate partner violence    Fear of current or ex partner: No    Emotionally abused: No    Physically abused: No    Forced sexual activity: No  Other Topics Concern   Not on file  Social History Narrative   Not on file    Family History: No family history on file.  Medications:   Current Outpatient Medications on File Prior to Visit  Medication Sig Dispense Refill   apixaban (ELIQUIS) 5 MG TABS tablet Take 1 tablet (5 mg total) by mouth 2 (two) times daily. 60 tablet    atenolol (TENORMIN) 50 MG tablet Take 1 tablet (50 mg total) by mouth daily.     ergocalciferol (VITAMIN D2) 50000 UNITS capsule Take 50,000 Units by mouth once a week.     hydrocerin (EUCERIN) CREA Apply 1 application topically 2 (two) times daily as needed (irritation).  0   hydroxyurea (HYDREA) 500 MG capsule Take 1 capsule (500 mg total) by mouth daily. May take with food to minimize GI side effects.     Maltodextrin-Xanthan Gum (RESOURCE THICKENUP CLEAR) POWD Take 120 g by mouth as needed (nectar thick liquids).     metoprolol tartrate (LOPRESSOR) 5 MG/5ML SOLN injection Inject 2.5 mLs (2.5 mg total) into the vein every 6 (six) hours as needed (Tachycardia). 15 mL    pravastatin (PRAVACHOL) 80 MG tablet TAKE 1 TABLET DAILY (Patient taking differently: Take 80 mg by mouth daily. ) 90 tablet 0   QUEtiapine (SEROQUEL) 25 MG tablet Take 0.5 tablets (12.5 mg total) by mouth at bedtime as needed (agitation).     Tamsulosin HCl (FLOMAX) 0.4 MG CAPS Take 0.4 mg by mouth daily.     No current facility-administered medications on file prior to visit.     Allergies:   Allergies  Allergen Reactions   Codeine     Per son, severity unknown     Physical Exam  There were no vitals filed for this visit. There is no height or weight on file to calculate  BMI. No exam data present  Depression screen [facility] 2/9 12/02/2018  Decreased Interest 0  Down, Depressed, Hopeless 0  PHQ - 2 Score 0  Altered sleeping 0  Tired, decreased energy 0  Change in appetite 0  Feeling bad or failure about yourself  0  Trouble concentrating 0  Moving slowly or fidgety/restless 0  Suicidal thoughts 0  PHQ-9 Score 0     General: well developed, well nourished, seated, in no evident distress Head: head normocephalic and atraumatic.   Neck: supple with no carotid or supraclavicular bruits Cardiovascular: regular rate and rhythm, no murmurs Musculoskeletal: no deformity Skin:  no rash/petichiae Vascular:  Normal pulses all extremities   Neurologic Exam  Mental Status: Awake and fully alert. Oriented to place and time. Recent and remote memory intact. Attention span, concentration and fund of knowledge appropriate. Mood and affect appropriate.  Cranial Nerves: Fundoscopic exam reveals sharp disc margins. Pupils equal, briskly reactive to light. Extraocular movements full without nystagmus. Visual fields full to confrontation. Hearing intact. Facial sensation intact. Face, tongue, palate moves normally and symmetrically.  Motor: Normal bulk and tone. Normal strength in all tested extremity muscles. Sensory.: intact to touch , pinprick , position and vibratory sensation.  Coordination: Rapid alternating movements normal in all extremities. Finger-to-nose and heel-to-shin performed accurately bilaterally. Gait and Station: Arises from chair without difficulty. Stance is normal. Gait demonstrates normal stride length and balance Reflexes: 1+ and symmetric. Toes downgoing.     NIHSS  *** Modified Rankin  *** CHA2DS2-VASc *** HAS-BLED ***   Diagnostic Data (Labs, Imaging, Testing)  Ct Head Code Stroke Wo Contrast 01/12/2019 1. Equivocal for hyperdense left MCA. No hemorrhage or visible acute infarct. ASPECTS is 10.  2. Prominent atrophy and chronic small vessel  ischemia.   Ct Code Stroke Cta Head W/wo Contrast Ct Code Stroke Cta Neck W/wo Contrast 01/12/2019 1. Left M1 embolus/occlusion with downstream reconstitution. There is 92 cc penumbra with no core infarct by CT perfusion.  2. Limited atheromatous changes.   Ct Code Stroke Cta Cerebral Perfusion W/wo Contrast 01/12/2019 1. Left M1 embolus/occlusion with downstream reconstitution. There is 92 cc penumbra with no core infarct by CT perfusion.  2. Limited atheromatous changes.   Cerebral Angiogram S/P lt common carotid arteriogram followed bt complete revascularization of occluded Lt MCA at origin with x 1 pass with 33mm x 40 mm solitaire x Retriever device achieving a TICI 3 revascularization.  Mr Brain Wo Contrast 01/13/2019 1. Patchy small volume acute ischemic nonhemorrhagic infarcts involving the left basal ganglia and subcortical left frontal lobe as above. 2. Underlying age-related cerebral atrophy with moderate chronic microvascular ischemic disease.   2D Echocardiogram 1. The left ventricle has hyperdynamic systolic function, with an ejection fraction of >65%. The cavity size was normal. There is mildly increased left ventricular wall thickness. Left ventricular diastolic Doppler parameters are indeterminate. No  evidence of left ventricular regional wall motion abnormalities. 2. The right ventricle has normal systolic function. The cavity was normal. There is no increase in right ventricular wall thickness. 3. Left atrial size was moderately dilated. 4. The pericardial effusion is localized near the right atrium. 5. Trivial pericardial effusion is present. 6. Tricuspid valve regurgitation is moderate. 7. The aortic valve is tricuspid. Mild thickening of the aortic valve. Mild calcification of the aortic valve. Aortic valve regurgitation is mild by color flow Doppler. 8. The aorta is normal unless otherwise noted. 9. No intracardiac thrombi or masses were visualized.      Renal US 1. Mild bilateral hydronephrosis and proximal hydroureter which may be secondary to bladder distension. The patient was unable to void during the examination. 2. Right renal cysts.    ASSESSMENT: Nicholas Contreras is a 85 y.o. year old male presented with garbled speech and right-sided weakness on 01/12/2019 with stroke work-up revealing left basal ganglia infarct s/p mechanical thrombectomy of occluded left MCA secondary to known A. fib not on anticoagulation. Vascular risk factors include thrombocytopenia, HTN, HLD, AF not on AC, and history of smokeless tobacco use.     PLAN:  1. Left BG stroke: Continue Eliquis (apixaban) daily  and pravastatin for secondary stroke prevention. Maintain strict control of hypertension with blood pressure  goal below 130/90, diabetes with hemoglobin A1c goal below 6.5% and cholesterol with LDL cholesterol (bad cholesterol) goal below 70 mg/dL.  I also advised the patient to eat a healthy diet with plenty of whole grains, cereals, fruits and vegetables, exercise regularly with at least 30 minutes of continuous activity daily and maintain ideal body weight. 2. Atrial fibrillation: Continuation of Eliquis and ongoing follow-up with cardiology for management and monitoring 3. HTN: Advised to continue current treatment regimen.  Today's BP ***.  Advised to continue to monitor at home along with continued follow-up with PCP for management 4. HLD: Advised to continue current treatment regimen along with continued follow-up with PCP for future prescribing and monitoring of lipid panel 5.     Follow up in *** or call earlier if needed   Greater than 50% of time during this 45 minute visit was spent on counseling, explanation of diagnosis of ***, reviewing risk factor management of ***, planning of further management along with potential future management, and discussion with patient and family answering all questions.    Frann Rider, AGNP-BC  [facility] [address] [place], [place] [address]  Phone [phone] Fax [phone] Note: This document was prepared with digital dictation and possible smart phrase technology. Any transcriptional errors that result from this process are unintentional.

## 2019-03-29 DEATH — deceased

## 2019-04-09 ENCOUNTER — Other Ambulatory Visit: Payer: Self-pay | Admitting: Internal Medicine

## 2019-04-13 ENCOUNTER — Other Ambulatory Visit: Payer: Self-pay

## 2019-04-13 NOTE — Patient Outreach (Signed)
First attempt to obtain mRs. Only number listed was busy. No DPR on file.

## 2019-05-05 ENCOUNTER — Other Ambulatory Visit: Payer: Self-pay

## 2019-05-05 NOTE — Patient Outreach (Signed)
Telephone outreach to patient to obtain mRs was successfully completed. Spoke with patient's spouse who confirmed patient deceased. mRs=6   [facility]

## 2020-03-28 IMAGING — MR MRI HEAD WITHOUT CONTRAST
12 of 13 series · 44 of 48 positions shown · non-contrast
Comparison: Prior CTs from 12/12/2018.

CLINICAL DATA: Follow-up examination for acute stroke.

EXAM:
MRI HEAD WITHOUT CONTRAST
TECHNIQUE: Multiplanar, multiecho pulse sequences of the brain and surrounding
structures were obtained without intravenous contrast.

[Series 5: DWI · axial · 3.0mm · 0.88mm/px · z∈[-32,+109]mm · 8 of 96 slices shown (1 of 4)]
[im 1/96]
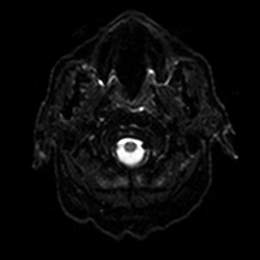
[im 14/96]
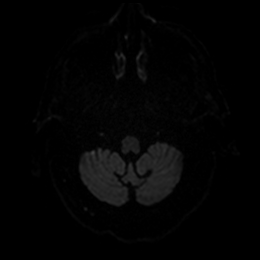
[im 28/96]
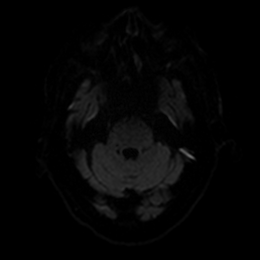
[im 41/96]
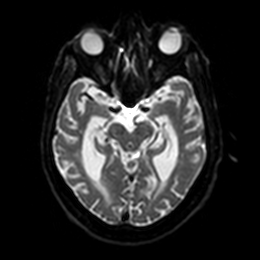
[im 55/96]
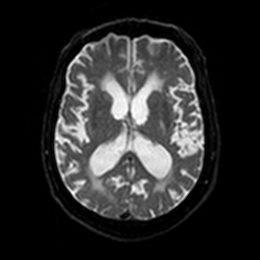
[im 68/96]
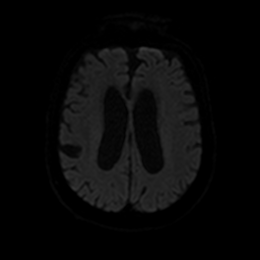
[im 82/96]
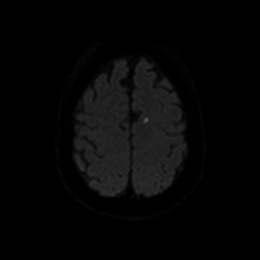
[im 96/96]
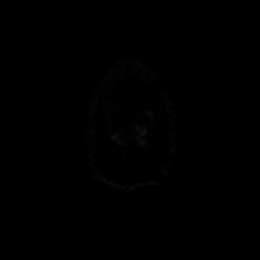

[Series 6: DWI · axial · 3.0mm · 0.88mm/px · z∈[-32,+109]mm · 4 of 48 slices shown (2 of 4)]
[im 1/48]
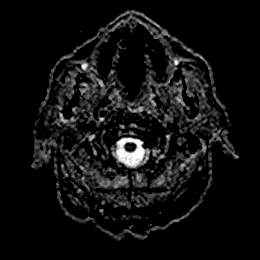
[im 16/48]
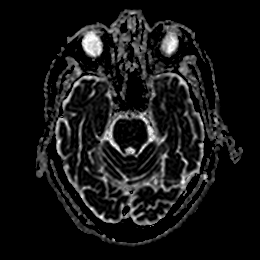
[im 32/48]
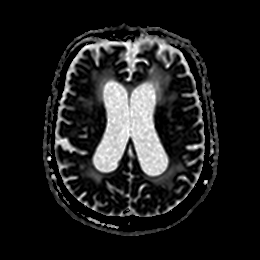
[im 48/48]
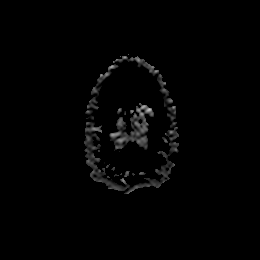

[Series 7: DWI · coronal · 4.0mm · 0.88mm/px · 5 of 72 slices shown (3 of 4)]
[im 1/72]
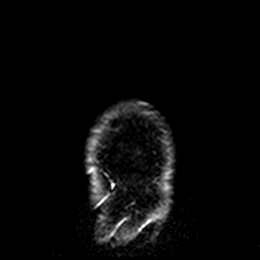
[im 18/72]
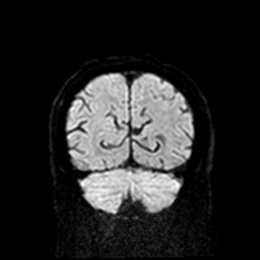
[im 36/72]
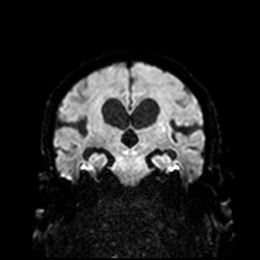
[im 54/72]
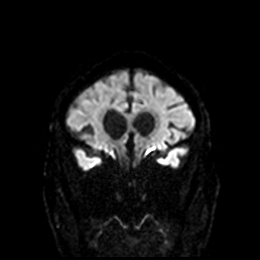
[im 72/72]
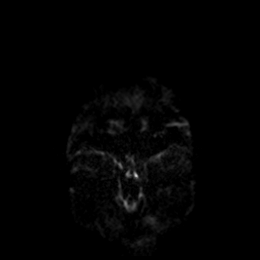

[Series 8: DWI · coronal · 4.0mm · 0.88mm/px · 3 of 36 slices shown (4 of 4)]
[im 1/36]
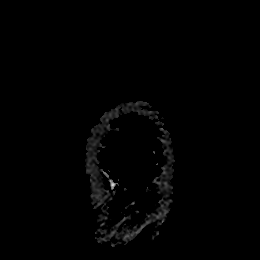
[im 18/36]
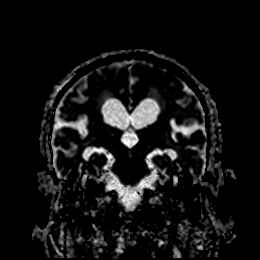
[im 36/36]
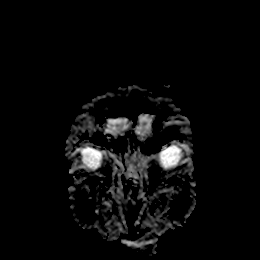

[Series 9: T1 · sagittal · 5.0mm · 0.75mm/px · 2 of 25 slices shown]
[im 1/25]
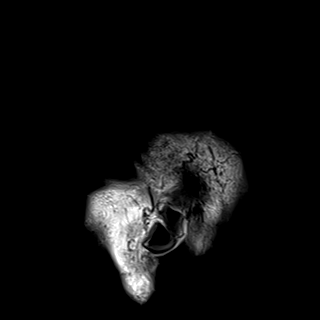
[im 25/25]
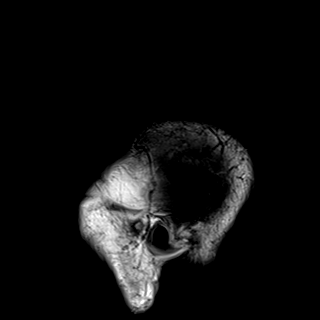

[Series 10: T2 · axial · 5.0mm · 0.72mm/px · z∈[-35,+108]mm · 2 of 25 slices shown (1 of 2)]
[im 1/25]
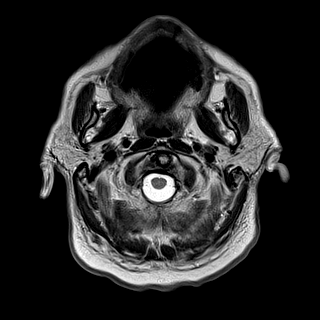
[im 25/25]
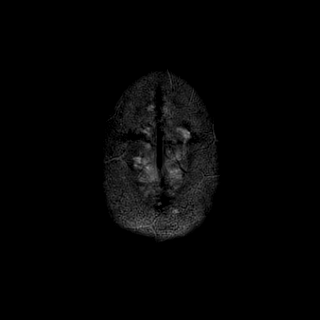

[Series 11: FLAIR · axial · 5.0mm · 0.45mm/px · z∈[-36,+107]mm · 2 of 25 slices shown]
[im 1/25]
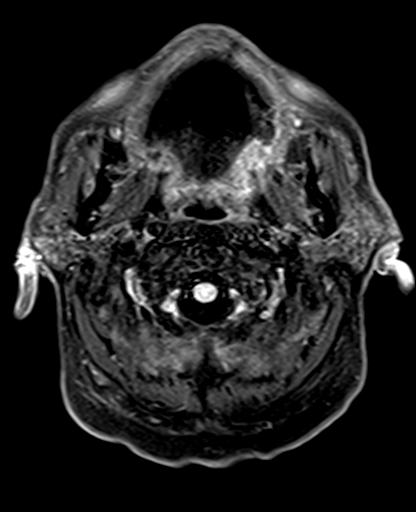
[im 25/25]
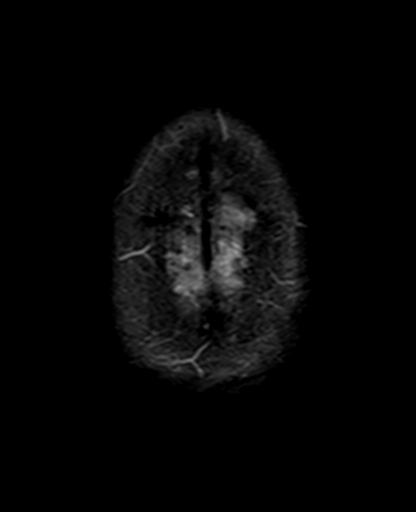

[Series 12: mag_images · axial · 3.0mm · 0.90mm/px · z∈[-44,+133]mm · 4 of 60 slices shown]
[im 1/60]
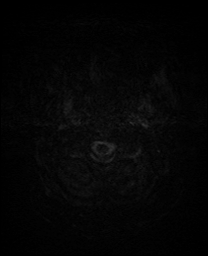
[im 20/60]
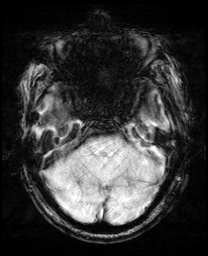
[im 40/60]
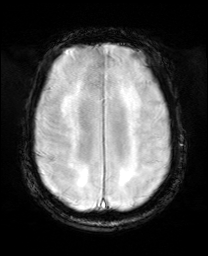
[im 60/60]
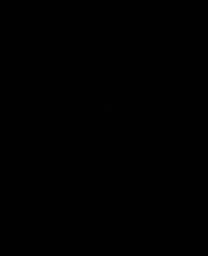

[Series 13: pha_images · axial · 3.0mm · 0.90mm/px · z∈[-38,+130]mm · 4 of 53 slices shown]
[im 1/53]
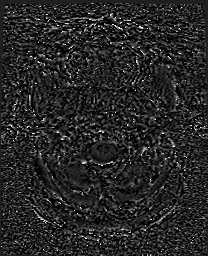
[im 18/53]
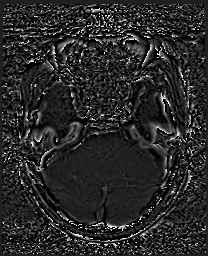
[im 35/53]
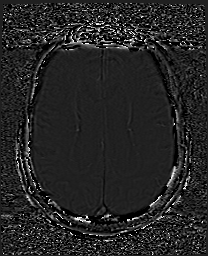
[im 53/53]
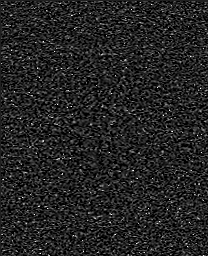

[Series 14: swi_images · axial · 3.0mm · 0.90mm/px · z∈[-44,+133]mm · 4 of 60 slices shown]
[im 1/60]
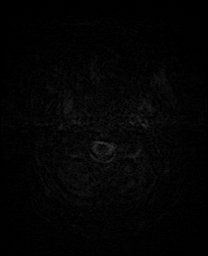
[im 20/60]
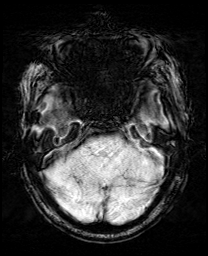
[im 40/60]
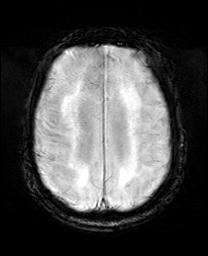
[im 60/60]
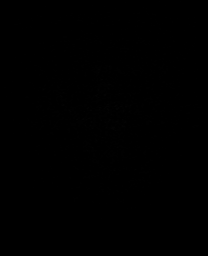

[Series 15: mip_images(sw) · axial · 24.0mm · 0.90mm/px · z∈[-33,+122]mm · 4 of 53 slices shown]
[im 1/53]
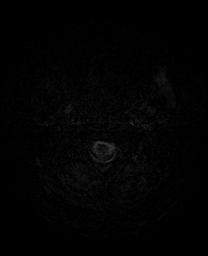
[im 18/53]
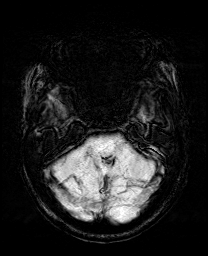
[im 35/53]
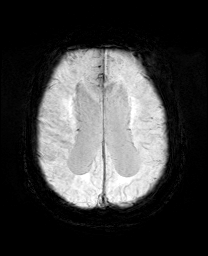
[im 53/53]
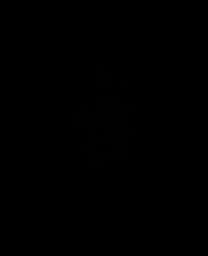

[Series 17: T2 · coronal · 5.0mm · 0.72mm/px · 2 of 28 slices shown (2 of 2)]
[im 1/28]
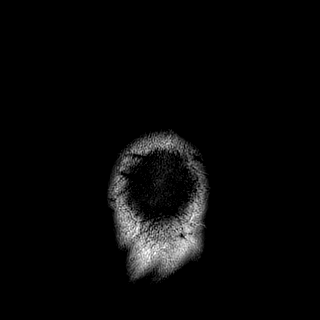
[im 28/28]
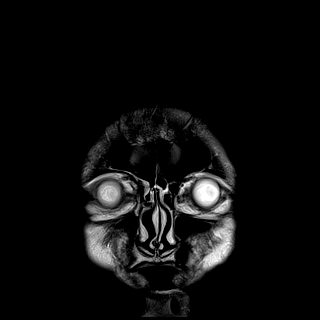

[44 of 48 positions shown; findings below may reference images not displayed]

FINDINGS: Brain: Diffuse prominence of the CSF containing spaces compatible
with generalized age-related cerebral atrophy. Patchy and confluent
T2/FLAIR hyperintensity within the periventricular deep white matter
both cerebral hemispheres most consistent with chronic small vessel
ischemic disease, moderate in nature. Superimposed remote lacunar
infarct involving the periventricular white matter adjacent to the
frontal horn of the left lateral ventricle noted.

Abnormal restricted diffusion involving the left basal ganglia,
extending from the left lentiform nucleus into the left caudate
noted, compatible with acute ischemic infarct. Additional
subcentimeter acute ischemic infarcts seen involving the subcortical
white matter of the high left frontal lobe (series 5, image 89). No
associated hemorrhage or mass effect. No other evidence for acute or
subacute ischemia. Gray-white matter differentiation otherwise
maintained. No other areas of remote cortical infarction. No foci of
susceptibility artifact to suggest acute or chronic intracranial
hemorrhage.

No mass lesion, midline shift or mass effect. Diffuse ventricular
prominence related to global parenchymal volume loss of
hydrocephalus. No extra-axial fluid collection.

Pituitary gland suprasellar region within normal limits. Midline
structures intact.

Vascular: Major intracranial vascular flow voids are maintained.

Skull and upper cervical spine: Craniocervical junction within
normal limits. Bone marrow signal intensity normal. No scalp soft
tissue abnormality.

Sinuses/Orbits: Patient status post bilateral ocular lens
replacement. Mild chronic mucoperiosteal thickening noted within the
ethmoidal air cells. Paranasal sinuses are otherwise clear. No
mastoid effusion. Inner ear structures grossly normal.

Other: None.
IMPRESSION: 1. Patchy small volume acute ischemic nonhemorrhagic infarcts
involving the left basal ganglia and subcortical left frontal lobe
as above.
2. Underlying age-related cerebral atrophy with moderate chronic
microvascular ischemic disease.
